# Patient Record
Sex: Female | Born: 1975 | Race: White | Hispanic: No | Marital: Single | Smoking: Current every day smoker
Health system: Southern US, Community
[De-identification: ages and names within clinical notes are randomized; demographics above are authoritative.]

## PROBLEM LIST (undated history)

## (undated) DIAGNOSIS — Z8489 Family history of other specified conditions: Secondary | ICD-10-CM

## (undated) DIAGNOSIS — F329 Major depressive disorder, single episode, unspecified: Secondary | ICD-10-CM

## (undated) DIAGNOSIS — C801 Malignant (primary) neoplasm, unspecified: Secondary | ICD-10-CM

## (undated) DIAGNOSIS — J189 Pneumonia, unspecified organism: Secondary | ICD-10-CM

## (undated) DIAGNOSIS — N200 Calculus of kidney: Secondary | ICD-10-CM

## (undated) DIAGNOSIS — Z87442 Personal history of urinary calculi: Secondary | ICD-10-CM

## (undated) DIAGNOSIS — F32A Depression, unspecified: Secondary | ICD-10-CM

## (undated) DIAGNOSIS — F319 Bipolar disorder, unspecified: Secondary | ICD-10-CM

## (undated) DIAGNOSIS — F419 Anxiety disorder, unspecified: Secondary | ICD-10-CM

## (undated) DIAGNOSIS — G8929 Other chronic pain: Secondary | ICD-10-CM

## (undated) DIAGNOSIS — F4310 Post-traumatic stress disorder, unspecified: Secondary | ICD-10-CM

## (undated) DIAGNOSIS — D649 Anemia, unspecified: Secondary | ICD-10-CM

## (undated) DIAGNOSIS — M549 Dorsalgia, unspecified: Secondary | ICD-10-CM

## (undated) DIAGNOSIS — F609 Personality disorder, unspecified: Secondary | ICD-10-CM

## (undated) HISTORY — PX: WISDOM TOOTH EXTRACTION: SHX21

## (undated) HISTORY — PX: CHOLECYSTECTOMY: SHX55

## (undated) HISTORY — PX: PARTIAL HYSTERECTOMY: SHX80

## (undated) HISTORY — DX: Calculus of kidney: N20.0

## (undated) HISTORY — PX: FRACTURE SURGERY: SHX138

## (undated) HISTORY — PX: TEMPOROMANDIBULAR JOINT SURGERY: SHX35

---

## 2002-07-02 ENCOUNTER — Emergency Department (HOSPITAL_COMMUNITY): Admission: EM | Admit: 2002-07-02 | Discharge: 2002-07-02 | Payer: Self-pay | Admitting: Emergency Medicine

## 2002-07-02 ENCOUNTER — Encounter: Payer: Self-pay | Admitting: Emergency Medicine

## 2003-11-09 DIAGNOSIS — D235 Other benign neoplasm of skin of trunk: Secondary | ICD-10-CM

## 2003-11-09 DIAGNOSIS — Z85820 Personal history of malignant melanoma of skin: Secondary | ICD-10-CM | POA: Insufficient documentation

## 2003-11-09 HISTORY — DX: Personal history of malignant melanoma of skin: Z85.820

## 2003-11-09 HISTORY — DX: Other benign neoplasm of skin of trunk: D23.5

## 2004-06-22 ENCOUNTER — Emergency Department: Payer: Self-pay | Admitting: Emergency Medicine

## 2004-10-10 ENCOUNTER — Emergency Department: Payer: Self-pay | Admitting: Emergency Medicine

## 2004-11-27 ENCOUNTER — Emergency Department: Payer: Self-pay | Admitting: Emergency Medicine

## 2005-02-07 ENCOUNTER — Emergency Department: Payer: Self-pay | Admitting: Emergency Medicine

## 2005-03-20 ENCOUNTER — Ambulatory Visit: Payer: Self-pay

## 2005-03-23 ENCOUNTER — Ambulatory Visit: Payer: Self-pay | Admitting: Urology

## 2005-03-30 ENCOUNTER — Ambulatory Visit: Payer: Self-pay | Admitting: Urology

## 2005-05-09 ENCOUNTER — Ambulatory Visit: Payer: Self-pay

## 2005-05-30 ENCOUNTER — Ambulatory Visit: Payer: Self-pay | Admitting: Unknown Physician Specialty

## 2005-06-09 ENCOUNTER — Emergency Department: Payer: Self-pay | Admitting: Emergency Medicine

## 2005-07-20 ENCOUNTER — Emergency Department: Payer: Self-pay | Admitting: Emergency Medicine

## 2005-07-20 ENCOUNTER — Other Ambulatory Visit: Payer: Self-pay

## 2005-09-10 ENCOUNTER — Emergency Department: Payer: Self-pay | Admitting: Emergency Medicine

## 2005-09-10 ENCOUNTER — Other Ambulatory Visit: Payer: Self-pay

## 2005-09-19 ENCOUNTER — Emergency Department: Payer: Self-pay | Admitting: Emergency Medicine

## 2006-02-25 ENCOUNTER — Emergency Department: Payer: Self-pay | Admitting: Emergency Medicine

## 2006-04-19 IMAGING — CT CT CHEST W/ CM
2 series · 16 of 31 positions shown, 20 images · non-contrast
Comparison: none

REASON FOR EXAM: ? PE
COMMENTS:  LMP: > one month ago

PROCEDURE:     CT  - CT CHEST (FOR PE) W  - July 20, 2005  [DATE]
RESULT:          Reason for consultation is chest pain.
TECHNIQUE: Axial contrast studies were obtained from the apices to the
hemidiaphragms with mediastinal and lung window settings obtained.
A good bolus of contrast is noted in the pulmonary arteries.  No filling
defects are noted to suggest pulmonary emboli.
No mediastinal masses are seen.  No hilar or subcarinal adenopathy is noted.
On the lung window settings, the lung fields are clear.  No effusions are
noted.

[Series 4: soft tissue · axial · 0.72mm/px · z∈[+227,+320]mm · 3 of 100 slices shown]
[im 8/100  mediastinal]
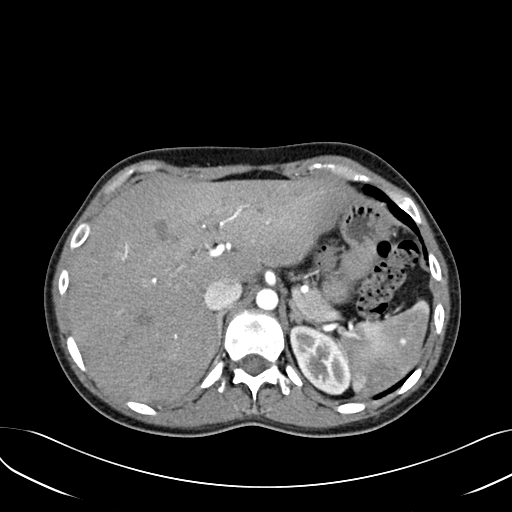
[im 23/100  mediastinal]
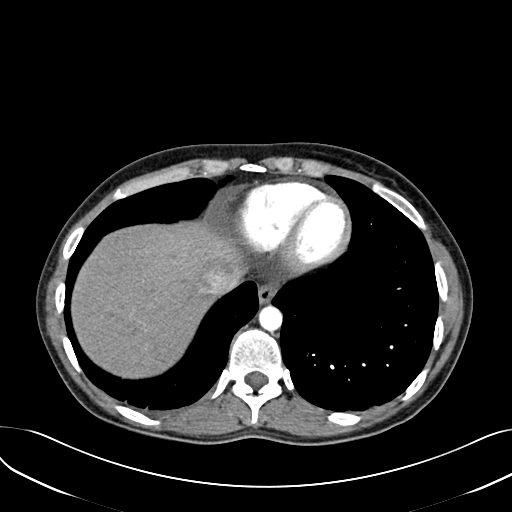
[im 39/100  mediastinal]
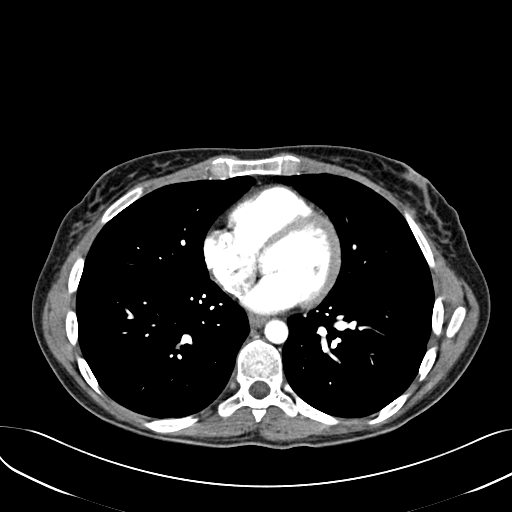

[Series 5: lung windows · axial · 0.72mm/px · z∈[+236,+479]mm · 13 of 97 slices shown, 17 images]
[im 8/97  mediastinal]
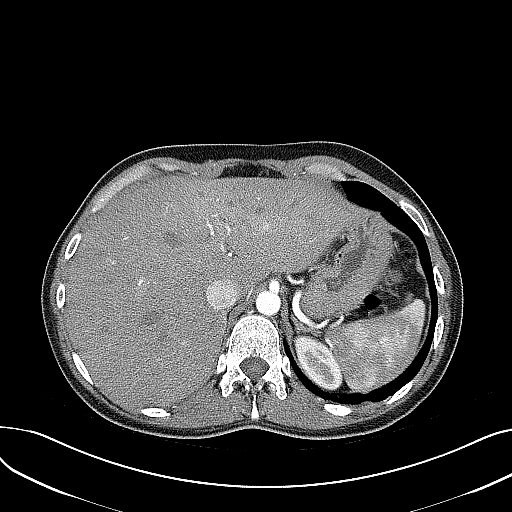
[im 8/97  lung]
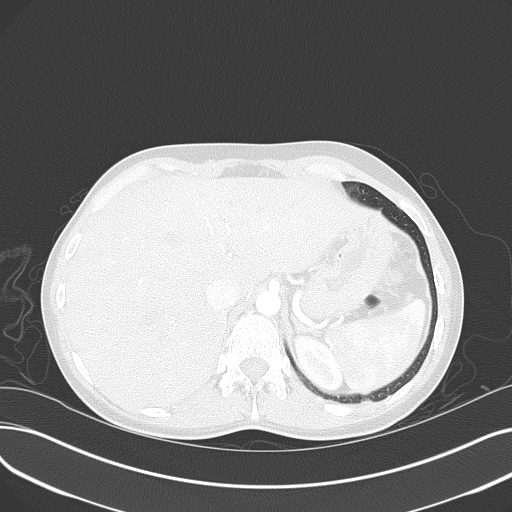
[im 15/97  lung]
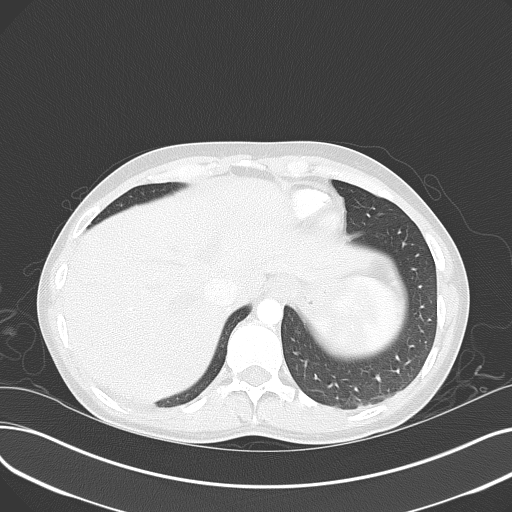
[im 23/97  lung]
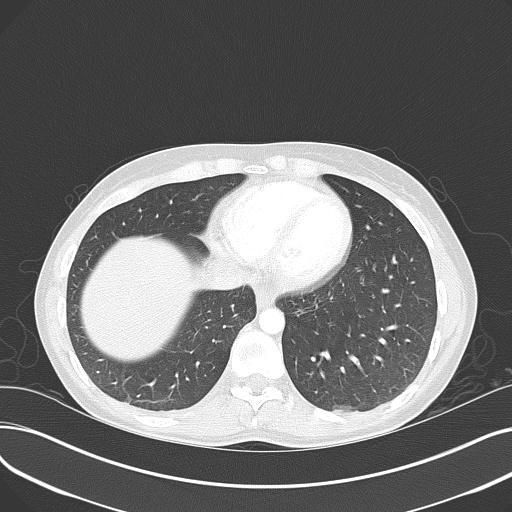
[im 30/97  lung]
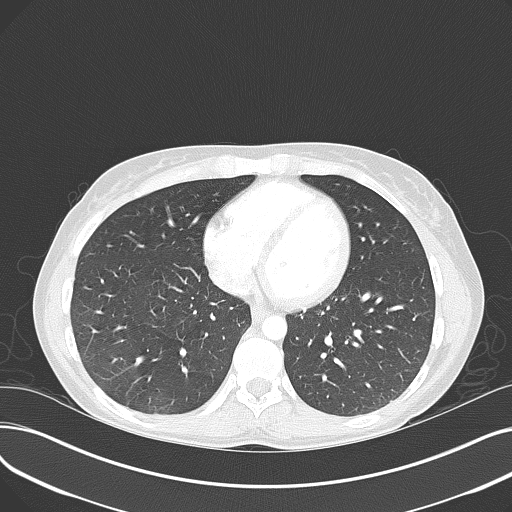
[im 37/97  mediastinal]
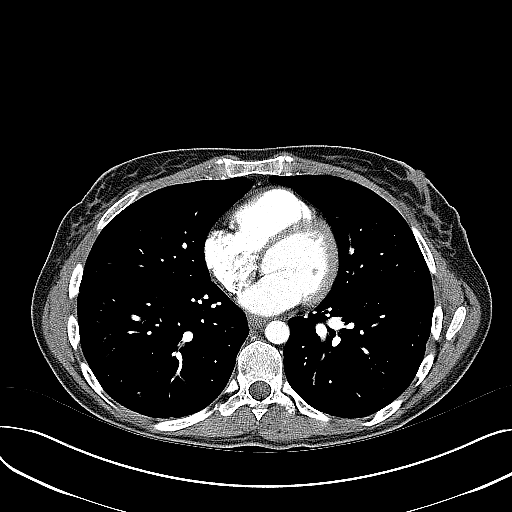
[im 37/97  lung]
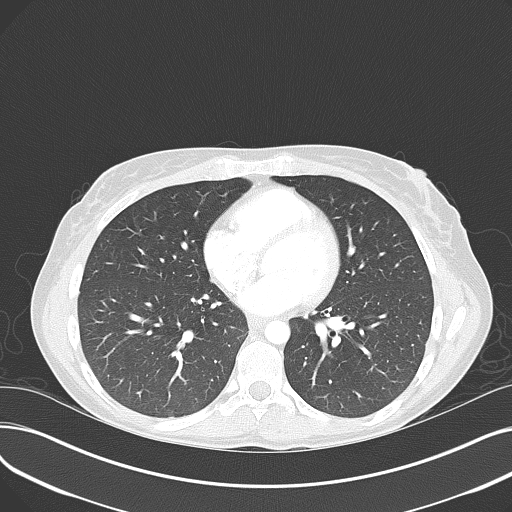
[im 45/97  lung]
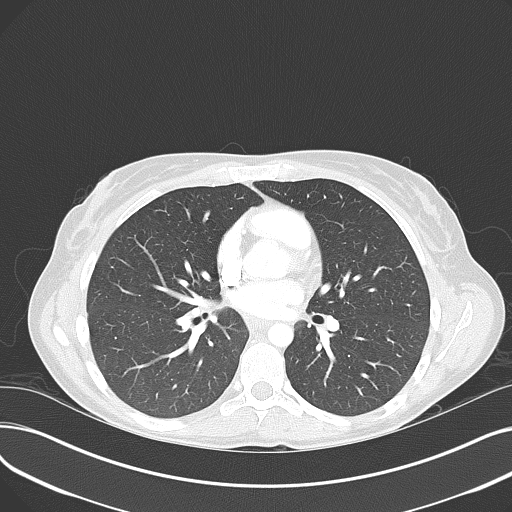
[im 49/97  lung]
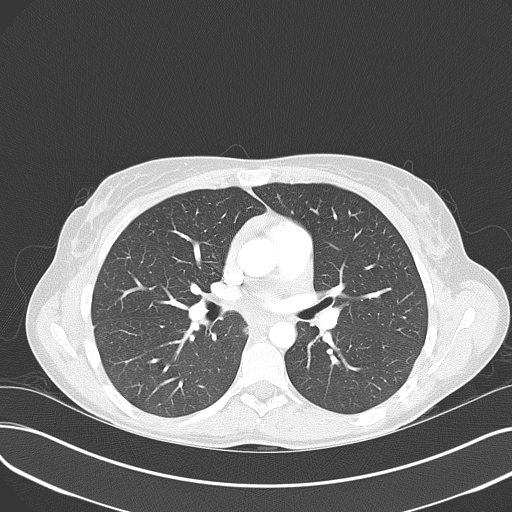
[im 52/97  lung]
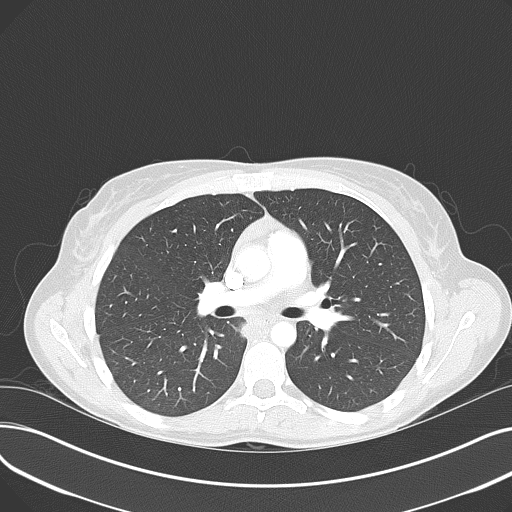
[im 60/97  mediastinal]
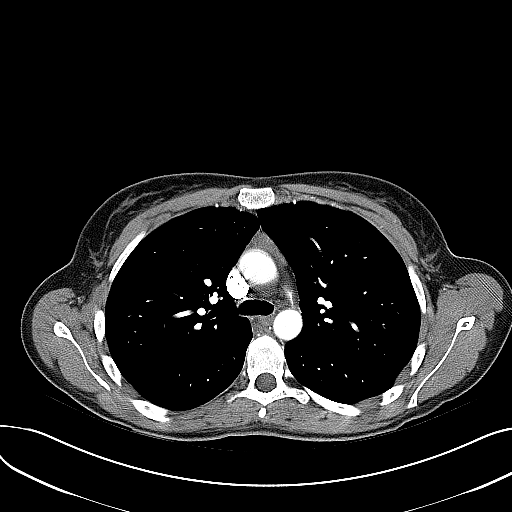
[im 60/97  lung]
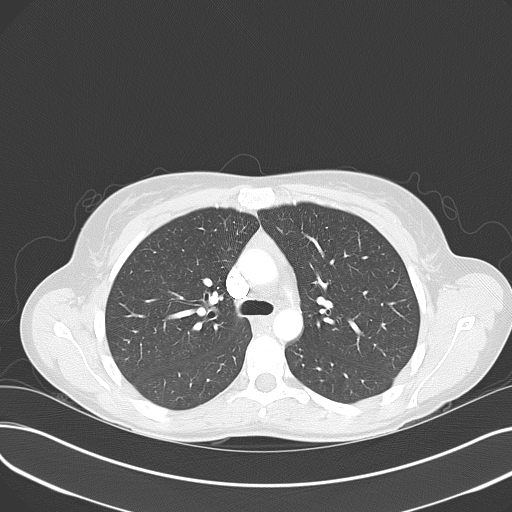
[im 67/97  lung]
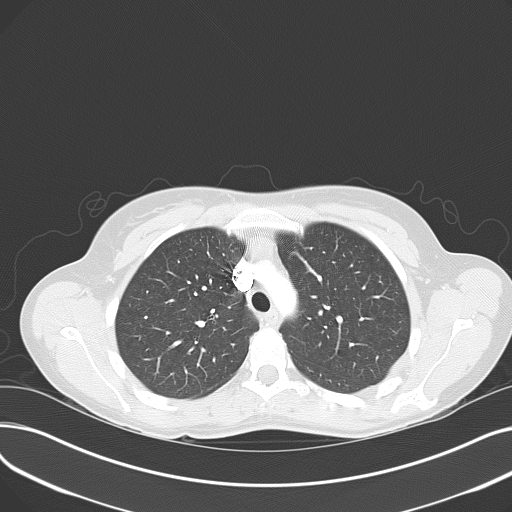
[im 74/97  lung]
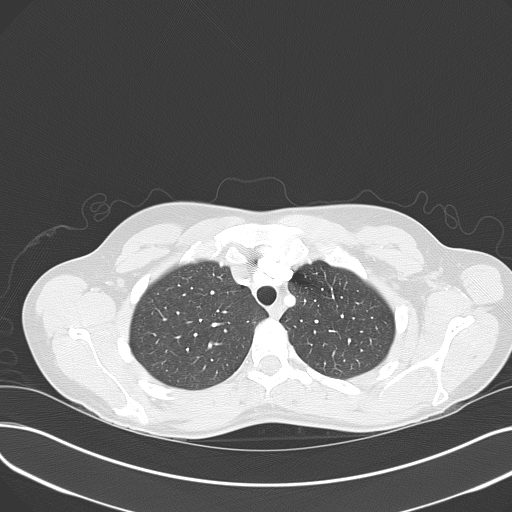
[im 82/97  lung]
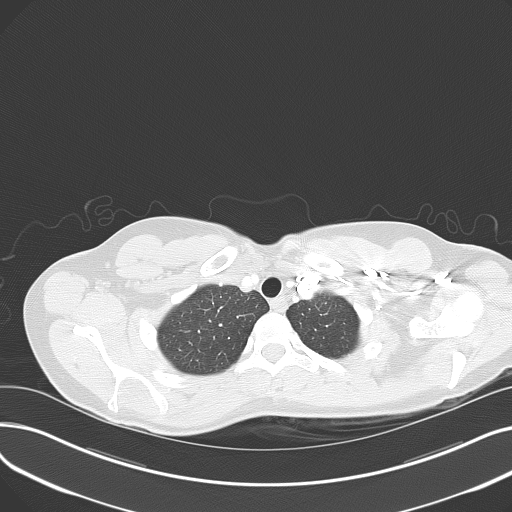
[im 89/97  mediastinal]
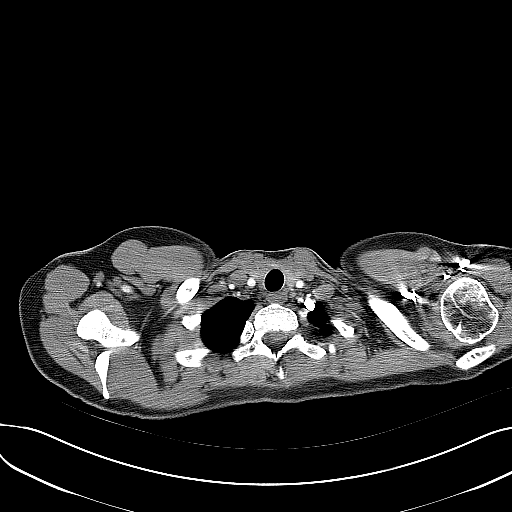
[im 89/97  lung]
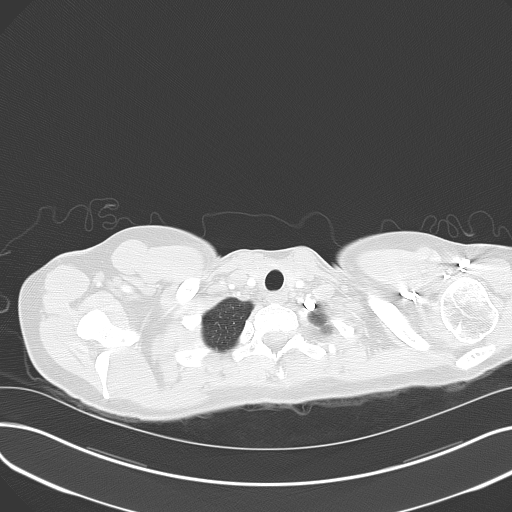

[16 of 31 positions shown; findings below may reference images not displayed]

IMPRESSION: No pulmonary emboli are identified.

The emergency room was contacted at the conclusion of the dictation.

## 2006-05-15 ENCOUNTER — Emergency Department: Payer: Self-pay | Admitting: Emergency Medicine

## 2006-06-18 ENCOUNTER — Ambulatory Visit: Payer: Self-pay | Admitting: Urology

## 2006-07-22 ENCOUNTER — Emergency Department: Payer: Self-pay | Admitting: Emergency Medicine

## 2006-08-12 ENCOUNTER — Emergency Department: Payer: Self-pay | Admitting: Unknown Physician Specialty

## 2006-09-24 ENCOUNTER — Emergency Department: Payer: Self-pay | Admitting: Emergency Medicine

## 2006-09-24 ENCOUNTER — Other Ambulatory Visit: Payer: Self-pay

## 2006-09-25 ENCOUNTER — Other Ambulatory Visit: Payer: Self-pay

## 2006-09-25 ENCOUNTER — Inpatient Hospital Stay: Payer: Self-pay | Admitting: Internal Medicine

## 2006-10-25 ENCOUNTER — Ambulatory Visit: Payer: Self-pay | Admitting: Internal Medicine

## 2006-11-15 ENCOUNTER — Ambulatory Visit: Payer: Self-pay | Admitting: Internal Medicine

## 2007-01-09 ENCOUNTER — Ambulatory Visit: Payer: Self-pay | Admitting: Internal Medicine

## 2007-02-26 ENCOUNTER — Emergency Department: Payer: Self-pay | Admitting: Emergency Medicine

## 2007-04-19 ENCOUNTER — Ambulatory Visit: Payer: Self-pay | Admitting: Internal Medicine

## 2007-06-24 IMAGING — CT CT HEAD WITHOUT CONTRAST
2 series · 16 of 30 positions shown, 20 images · non-contrast
Comparison: none

REASON FOR EXAM: seizure
COMMENTS:

[Series 2: without · axial · non-contrast · 0.41mm/px · z∈[+872,+992]mm · 13 of 30 slices shown, 17 images]
[im 3/30  brain]
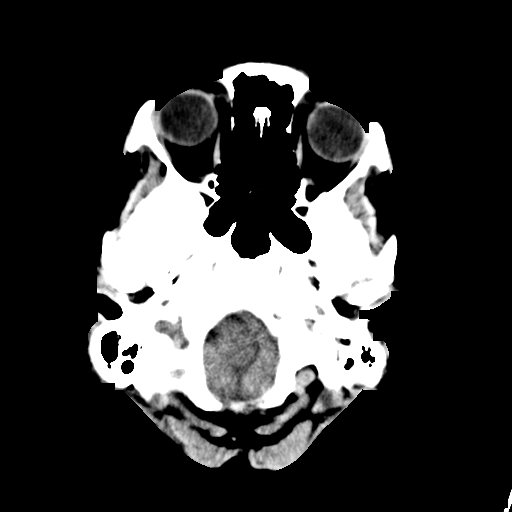
[im 3/30  bone]
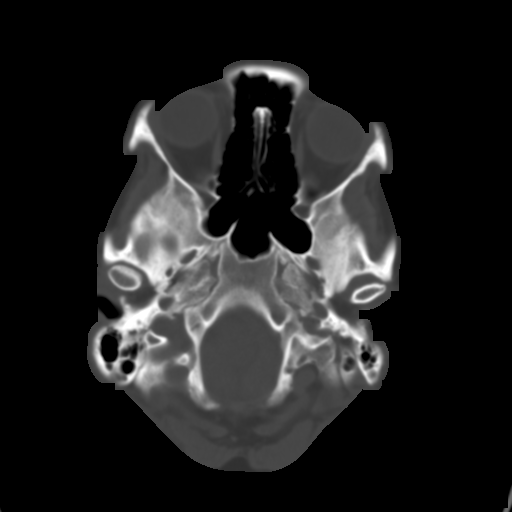
[im 5/30  brain]
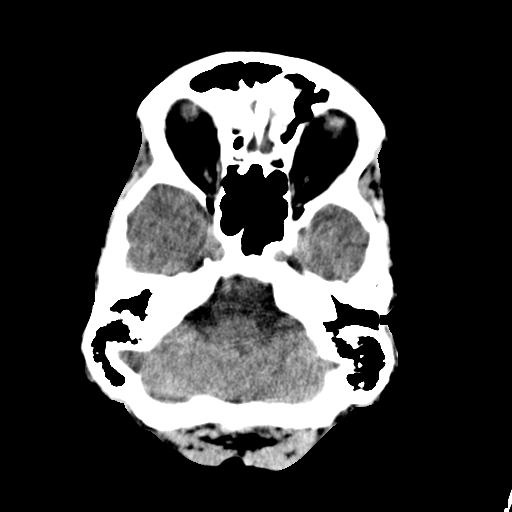
[im 7/30  brain]
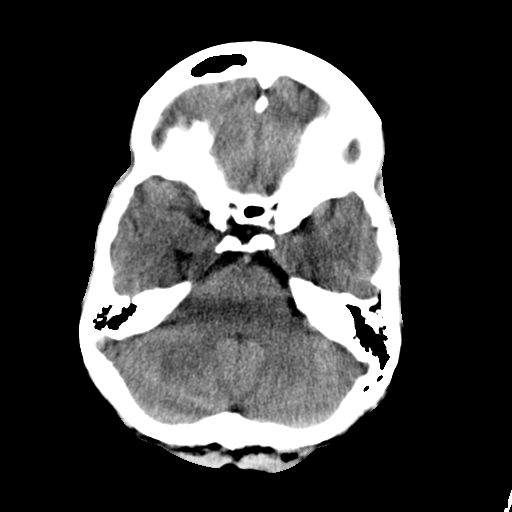
[im 9/30  brain]
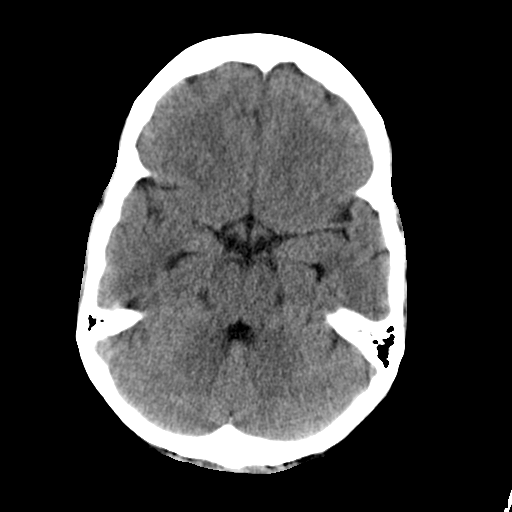
[im 11/30  brain]
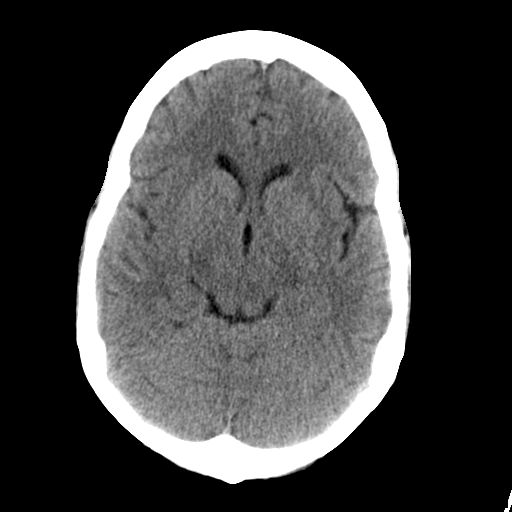
[im 11/30  bone]
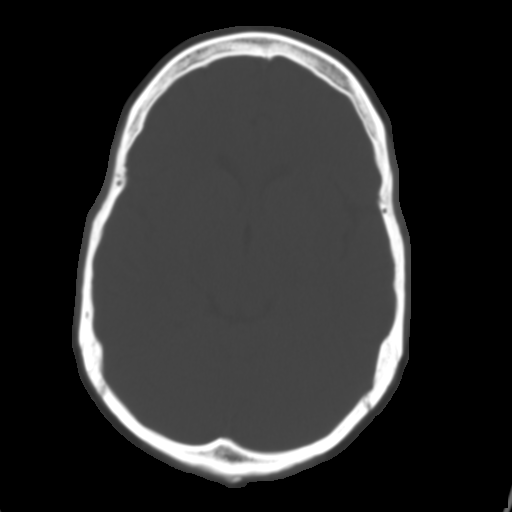
[im 13/30  brain]
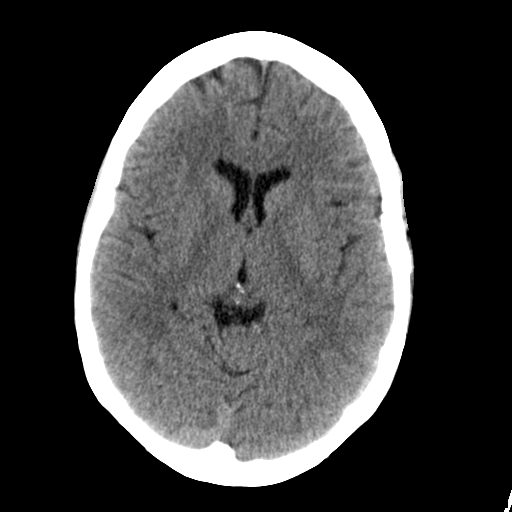
[im 15/30  brain]
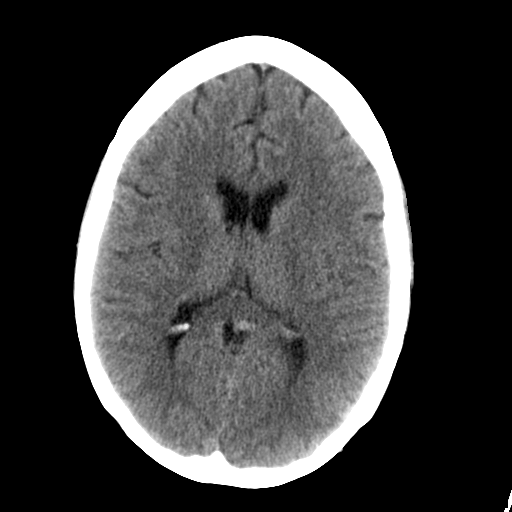
[im 17/30  brain]
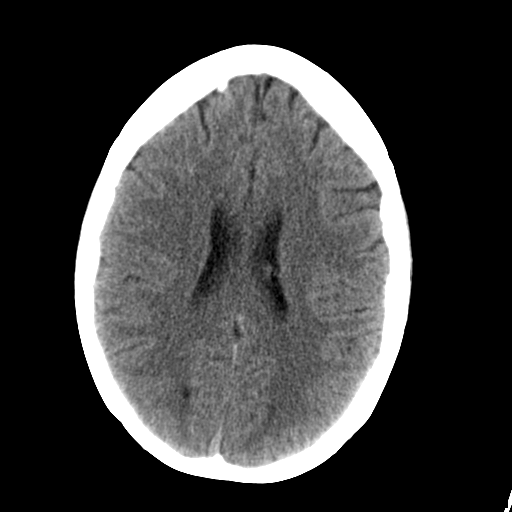
[im 19/30  brain]
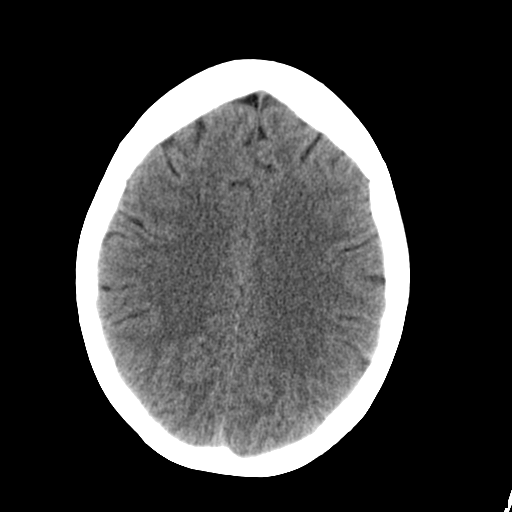
[im 19/30  bone]
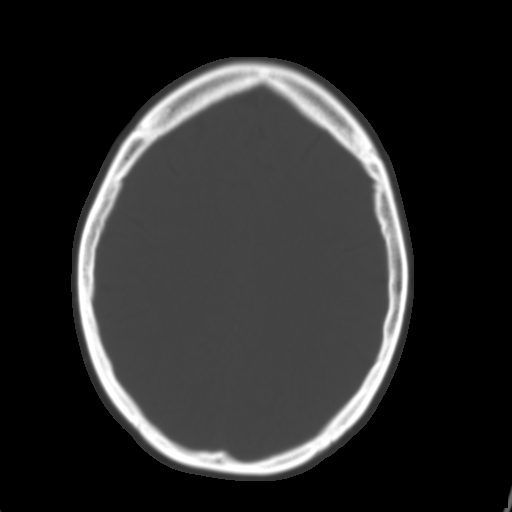
[im 21/30  brain]
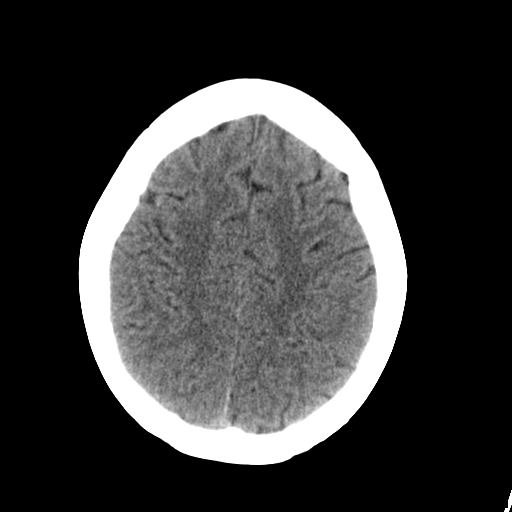
[im 23/30  brain]
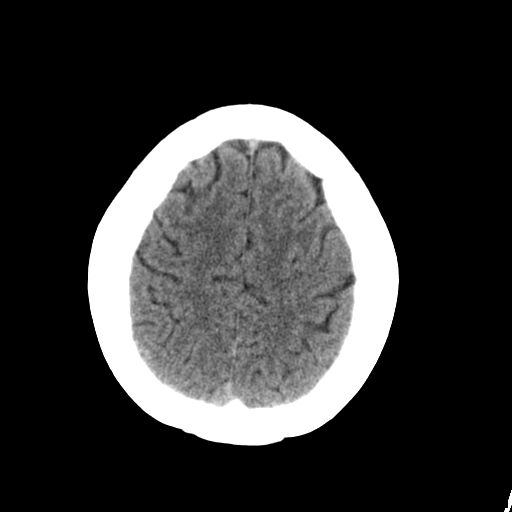
[im 25/30  brain]
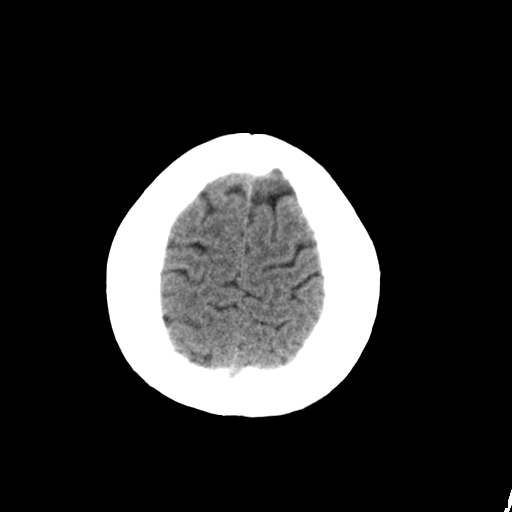
[im 27/30  brain]
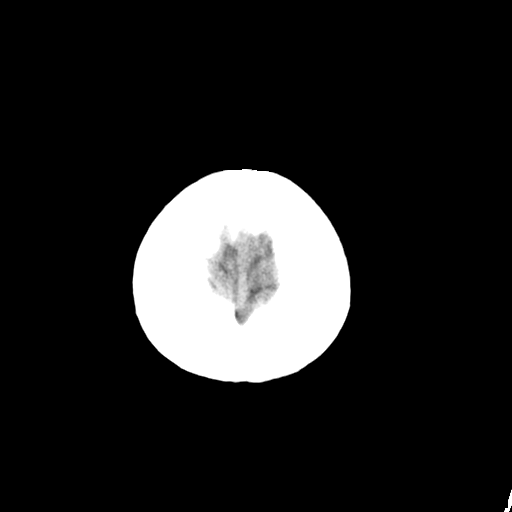
[im 27/30  bone]
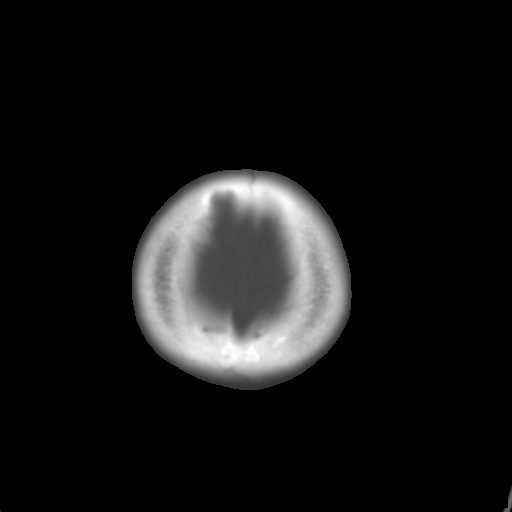

[Series 3: bone · axial · 0.41mm/px · z∈[+872,+912]mm · 3 of 30 slices shown]
[im 3/30  bone]
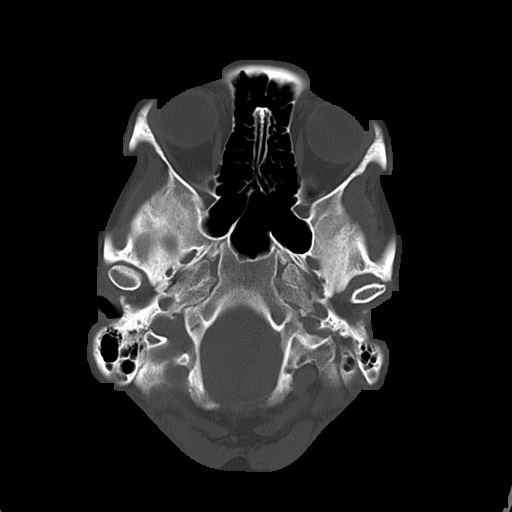
[im 7/30  bone]
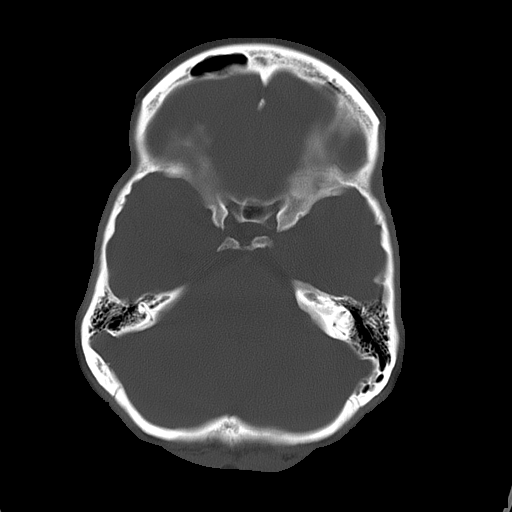
[im 11/30  bone]
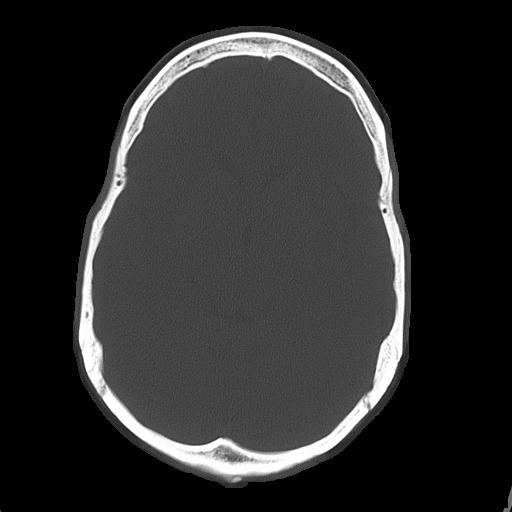

[16 of 30 positions shown; findings below may reference images not displayed]

PROCEDURE:     CT  - CT HEAD WITHOUT CONTRAST  - September 24, 2006  [DATE]

RESULT:     Emergent noncontrast CT of the brain is performed. There are no
prior studies for comparison. The ventricles and sulci are normal. There is
no hemorrhage. There is no focal mass, mass-effect or midline shift. There
is no evidence of edema or territorial infarct. The bone windows demonstrate
normal aeration of the paranasal sinuses and mastoid air cells. There is no
skull fracture demonstrated.
IMPRESSION: 1. No acute intracranial abnormality.
2. If this is a new onset of seizures an MRI would be suggested for further
investigation.

## 2007-09-29 ENCOUNTER — Ambulatory Visit: Payer: Self-pay | Admitting: Internal Medicine

## 2007-10-02 ENCOUNTER — Emergency Department: Payer: Self-pay | Admitting: Emergency Medicine

## 2007-10-30 ENCOUNTER — Emergency Department: Payer: Self-pay | Admitting: Unknown Physician Specialty

## 2008-05-13 ENCOUNTER — Emergency Department: Payer: Self-pay | Admitting: Unknown Physician Specialty

## 2008-05-24 ENCOUNTER — Emergency Department: Payer: Self-pay | Admitting: Emergency Medicine

## 2008-07-29 ENCOUNTER — Ambulatory Visit: Payer: Self-pay | Admitting: Specialist

## 2008-08-15 ENCOUNTER — Inpatient Hospital Stay: Payer: Self-pay | Admitting: Unknown Physician Specialty

## 2008-09-10 ENCOUNTER — Inpatient Hospital Stay: Payer: Self-pay | Admitting: Psychiatry

## 2009-03-22 ENCOUNTER — Ambulatory Visit: Payer: Self-pay | Admitting: Internal Medicine

## 2009-12-25 ENCOUNTER — Emergency Department: Payer: Self-pay | Admitting: Emergency Medicine

## 2010-11-15 ENCOUNTER — Emergency Department: Payer: Self-pay | Admitting: Emergency Medicine

## 2011-05-25 ENCOUNTER — Emergency Department: Payer: Self-pay | Admitting: Unknown Physician Specialty

## 2011-09-01 ENCOUNTER — Emergency Department: Payer: Self-pay | Admitting: Emergency Medicine

## 2011-09-01 ENCOUNTER — Ambulatory Visit: Payer: Self-pay | Admitting: Anesthesiology

## 2011-09-01 LAB — COMPREHENSIVE METABOLIC PANEL
Albumin: 4.4 g/dL (ref 3.4–5.0)
Alkaline Phosphatase: 73 U/L (ref 50–136)
Anion Gap: 10 (ref 7–16)
BUN: 13 mg/dL (ref 7–18)
Bilirubin,Total: 0.7 mg/dL (ref 0.2–1.0)
Creatinine: 1.11 mg/dL (ref 0.60–1.30)
EGFR (African American): >60
Sodium: 138 mmol/L (ref 136–145)
Total Protein: 8.4 g/dL — ABNORMAL HIGH (ref 6.4–8.2)

## 2011-09-01 LAB — CBC
HGB: 13.6 g/dL (ref 12.0–16.0)
MCHC: 32.9 g/dL (ref 32.0–36.0)
Platelet: 297 10*3/uL (ref 150–440)
WBC: 11.2 10*3/uL — ABNORMAL HIGH (ref 3.6–11.0)

## 2011-09-01 LAB — URINALYSIS, COMPLETE
Bacteria: NONE SEEN
Glucose,UR: NEGATIVE mg/dL (ref 0–75)
Leukocyte Esterase: NEGATIVE
Nitrite: NEGATIVE
Ph: 7 (ref 4.5–8.0)
Protein: 30
RBC,UR: 5 /HPF (ref 0–5)
Specific Gravity: 1.021 (ref 1.003–1.030)

## 2012-01-24 ENCOUNTER — Ambulatory Visit: Payer: Self-pay | Admitting: Family Medicine

## 2012-01-24 LAB — CREATININE, SERUM
EGFR (African American): >60
EGFR (Non-African Amer.): >60

## 2012-02-05 ENCOUNTER — Emergency Department (HOSPITAL_COMMUNITY)
Admission: EM | Admit: 2012-02-05 | Discharge: 2012-02-05 | Disposition: A | Discharge status: Home / Self Care | Payer: Medicare Other | Attending: Emergency Medicine | Admitting: Emergency Medicine

## 2012-02-05 ENCOUNTER — Encounter (HOSPITAL_COMMUNITY): Payer: Self-pay

## 2012-02-05 ENCOUNTER — Emergency Department (HOSPITAL_COMMUNITY): Payer: Medicare Other

## 2012-02-05 DIAGNOSIS — W19XXXA Unspecified fall, initial encounter: Secondary | ICD-10-CM

## 2012-02-05 DIAGNOSIS — W1809XA Striking against other object with subsequent fall, initial encounter: Secondary | ICD-10-CM | POA: Insufficient documentation

## 2012-02-05 DIAGNOSIS — Z7982 Long term (current) use of aspirin: Secondary | ICD-10-CM | POA: Insufficient documentation

## 2012-02-05 DIAGNOSIS — R51 Headache: Secondary | ICD-10-CM | POA: Insufficient documentation

## 2012-02-05 DIAGNOSIS — S139XXA Sprain of joints and ligaments of unspecified parts of neck, initial encounter: Secondary | ICD-10-CM | POA: Insufficient documentation

## 2012-02-05 DIAGNOSIS — M542 Cervicalgia: Secondary | ICD-10-CM | POA: Insufficient documentation

## 2012-02-05 DIAGNOSIS — Z79899 Other long term (current) drug therapy: Secondary | ICD-10-CM | POA: Insufficient documentation

## 2012-02-05 HISTORY — DX: Other chronic pain: G89.29

## 2012-02-05 HISTORY — DX: Depression, unspecified: F32.A

## 2012-02-05 HISTORY — DX: Personality disorder, unspecified: F60.9

## 2012-02-05 HISTORY — DX: Malignant (primary) neoplasm, unspecified: C80.1

## 2012-02-05 HISTORY — DX: Anxiety disorder, unspecified: F41.9

## 2012-02-05 HISTORY — DX: Post-traumatic stress disorder, unspecified: F43.10

## 2012-02-05 HISTORY — DX: Bipolar disorder, unspecified: F31.9

## 2012-02-05 HISTORY — DX: Major depressive disorder, single episode, unspecified: F32.9

## 2012-02-05 HISTORY — DX: Dorsalgia, unspecified: M54.9

## 2012-02-05 MED ORDER — CYCLOBENZAPRINE HCL 10 MG PO TABS: 10.0000 mg | ORAL_TABLET | Freq: Two times a day (BID) | ORAL | Status: DC | PRN

## 2012-02-05 MED ORDER — HYDROCODONE-ACETAMINOPHEN 5-325 MG PO TABS
1.0000 | ORAL_TABLET | Freq: Once | ORAL | Status: AC
  Administered 2012-02-05: 1 via ORAL
  Filled 2012-02-05: qty 1

## 2012-02-05 MED ORDER — TRAMADOL HCL 50 MG PO TABS: 50.0000 mg | ORAL_TABLET | Freq: Four times a day (QID) | ORAL | Status: DC | PRN

## 2012-02-05 MED ORDER — IBUPROFEN 800 MG PO TABS: 800.0000 mg | ORAL_TABLET | Freq: Three times a day (TID) | ORAL | Status: DC

## 2012-02-05 NOTE — ED Provider Notes (Signed)
History     CSN: [id number]  Arrival date & time 02/05/12  1311   First MD Initiated Contact with Patient 02/05/12 1347      Chief Complaint  Patient presents with  . Neck Pain    Per EMS-pt stated that she struck her head in restroom of MD office. Pt was alert and oriented, c/o neck pain     (Consider location/radiation/quality/duration/timing/severity/associated sxs/prior treatment) Patient is a 36 y.o. female presenting with neck pain. The history is provided by the patient.  Neck Pain  The current episode started 1 to 2 hours ago. The problem occurs constantly. The pain is associated with a fall. There has been no fever. Associated symptoms include headaches. Associated symptoms comments: She reports slipping on wet surface, falling and hitting head against a bathroom stall partition. No LOC. No nausea or vomiting. She complains of head and neck pain..    Past Medical History  Diagnosis Date  . Anxiety   . Bipolar disorder   . Depression   . Personality disorder   . PTSD (post-traumatic stress disorder)   . Cancer skin cancer  . Chronic back pain     Past Surgical History  Procedure Date  . Partial hysterectomy     No family history on file.  History  Substance Use Topics  . Smoking status: Current Every Day Smoker  . Smokeless tobacco: Not on file  . Alcohol Use: No    OB History    Grav Para Term Preterm Abortions TAB SAB Ect Mult Living                  Review of Systems  Constitutional: Negative for fever.  HENT: Positive for neck pain.   Gastrointestinal: Negative for nausea and vomiting.  Neurological: Positive for headaches.    Allergies  Fentanyl; Neosporin; and Zantac  Home Medications   Current Outpatient Rx  Name Route Sig Dispense Refill  . ALPRAZOLAM 1 MG PO TABS Oral Take 1 mg by mouth 4 (four) times daily as needed.    . AMPHETAMINE-DEXTROAMPHETAMINE 15 MG PO TABS Oral Take 30 mg by mouth daily.    . ASPIRIN-ACETAMINOPHEN-CAFFEINE  250-250-65 MG PO TABS Oral Take 1 tablet by mouth every 6 (six) hours as needed. For migraine    . GOODY HEADACHE PO Oral Take 1 packet by mouth every 6 (six) hours as needed. For aches    . CLONIDINE HCL 0.1 MG PO TABS Oral Take 0.1 mg by mouth 2 (two) times daily as needed. For high blood pressure    . DESVENLAFAXINE SUCCINATE ER 100 MG PO TB24 Oral Take 100 mg by mouth daily.    Marland Kitchen GABAPENTIN 600 MG PO TABS Oral Take 600 mg by mouth 3 (three) times daily.    . IBUPROFEN 200 MG PO TABS Oral Take 200-800 mg by mouth every 6 (six) hours as needed. For pain    . MELATONIN PO Oral Take 1 tablet by mouth at bedtime.    Marland Kitchen METFORMIN HCL 500 MG PO TABS Oral Take 500 mg by mouth 2 (two) times daily with a meal.    . METOCLOPRAMIDE HCL 10 MG PO TABS Oral Take 10 mg by mouth 2 (two) times daily.    Marland Kitchen MODAFINIL 200 MG PO TABS Oral Take 200 mg by mouth daily with breakfast.    . NAPROXEN SODIUM 220 MG PO TABS Oral Take 220 mg by mouth 2 (two) times daily with a meal.    . NORGESTIMATE-ETH  ESTRADIOL 0.25-35 MG-MCG PO TABS Oral Take 1 tablet by mouth daily.    . OXYBUTYNIN CHLORIDE 5 MG PO TABS Oral Take 5 mg by mouth 2 (two) times daily.    . OXYCODONE HCL 15 MG PO TABS Oral Take 15 mg by mouth every 4 (four) hours as needed. For pain    . PANTOPRAZOLE SODIUM 40 MG PO TBEC Oral Take 40 mg by mouth daily.    Marland Kitchen POTASSIUM CHLORIDE ER 10 MEQ PO TBCR Oral Take 10 mEq by mouth daily.    Marland Kitchen PROMETHAZINE HCL 25 MG PO TABS Oral Take 25 mg by mouth 2 (two) times daily.    Marland Kitchen GAS RELIEF PO Oral Take 1 tablet by mouth as needed. For gas relief    . SIMVASTATIN 20 MG PO TABS Oral Take 20 mg by mouth every evening.      BP 120/77  Pulse 90  Temp 98.2 F (36.8 C) (Oral)  Resp 20  SpO2 97%  Physical Exam  Constitutional: She is oriented to person, place, and time. She appears well-developed and well-nourished.  HENT:  Head: Normocephalic and atraumatic.  Eyes: Pupils are equal, round, and reactive to light.  Neck:  Normal range of motion. Neck supple.  Cardiovascular: Normal rate and regular rhythm.   Pulmonary/Chest: Effort normal and breath sounds normal.  Abdominal: Soft. Bowel sounds are normal. There is no tenderness. There is no rebound and no guarding.  Musculoskeletal: Normal range of motion.       Multi-level midline cervical and paracervical tenderness. No swelling, no bruising.   Neurological: She is alert and oriented to person, place, and time. She has normal strength and normal reflexes. No sensory deficit. She displays a negative Romberg sign. Coordination normal.  Skin: Skin is warm and dry. No rash noted.  Psychiatric: She has a normal mood and affect.    ED Course  Procedures (including critical care time)  Labs Reviewed - No data to display Ct Head Wo Contrast  02/05/2012  *RADIOLOGY REPORT*  Clinical Data:  Fall, injury to back of head.  CT HEAD WITHOUT CONTRAST CT CERVICAL SPINE WITHOUT CONTRAST  Technique:  Multidetector CT imaging of the head and cervical spine was performed following the standard protocol without intravenous contrast.  Multiplanar CT image reconstructions of the cervical spine were also generated.  Comparison:   None  CT HEAD  Findings: No intracranial hemorrhage.  No parenchymal contusion. No midline shift or mass effect.  Basilar cisterns are patent. No skull base fracture.  No fluid in the paranasal sinuses or mastoid air cells.  IMPRESSION: No intracranial trauma.  Normal head CT.  CT CERVICAL SPINE  Findings: No prevertebral soft tissue swelling.  Normal alignment of cervical vertebral bodies.  No loss of vertebral body height. Normal facet articulation.  Normal craniocervical junction.  No evidence epidural or paraspinal hematoma.  IMPRESSION: No cervical spine fracture.   Original Report Authenticated By: Genevive Bi, M.D.    Ct Cervical Spine Wo Contrast  02/05/2012  *RADIOLOGY REPORT*  Clinical Data:  Fall, injury to back of head.  CT HEAD WITHOUT  CONTRAST CT CERVICAL SPINE WITHOUT CONTRAST  Technique:  Multidetector CT imaging of the head and cervical spine was performed following the standard protocol without intravenous contrast.  Multiplanar CT image reconstructions of the cervical spine were also generated.  Comparison:   None  CT HEAD  Findings: No intracranial hemorrhage.  No parenchymal contusion. No midline shift or mass effect.  Basilar cisterns are  patent. No skull base fracture.  No fluid in the paranasal sinuses or mastoid air cells.  IMPRESSION: No intracranial trauma.  Normal head CT.  CT CERVICAL SPINE  Findings: No prevertebral soft tissue swelling.  Normal alignment of cervical vertebral bodies.  No loss of vertebral body height. Normal facet articulation.  Normal craniocervical junction.  No evidence epidural or paraspinal hematoma.  IMPRESSION: No cervical spine fracture.   Original Report Authenticated By: Genevive Bi, M.D.      No diagnosis found.  1. Fall 2. Cervical sprain   MDM  CT c-spine and head negative. Collar removed. Re-eval:  No swelling or bruising to head or neck after period of observation. Patient sleeping on re-eval without distress or obvious discomfort.         Rodena Medin, PA-C 02/05/12 1528

## 2012-02-05 NOTE — ED Notes (Signed)
Pt fell today at the psychiatrist's office, slipped in bathroom and hit the back of her head on the wall, no LOC and was able to crawl back to examination room, and ambulance was called. Pt is tearful and cried "I crawl to the doctor and the doctor just didn't care". Pt was the office for anxiety, takes xanax with no relief.

## 2012-02-05 NOTE — ED Notes (Signed)
Pt presented to ED on C-collar and LSB. Pt is now off the spinal board. Pain at lower back and mid back. Hx of chronic back pain and is getting her pain meds from the pain clinic.

## 2012-02-05 NOTE — ED Notes (Signed)
Patient transported to CT 

## 2012-02-05 NOTE — ED Provider Notes (Signed)
Medical screening examination/treatment/procedure(s) were performed by non-physician practitioner and as supervising physician I was immediately available for consultation/collaboration.  Jones Skene, M.D.     Jones Skene, MD 02/05/12 1814

## 2012-10-22 ENCOUNTER — Ambulatory Visit: Payer: Self-pay | Admitting: Anesthesiology

## 2014-11-19 ENCOUNTER — Other Ambulatory Visit: Payer: Self-pay | Admitting: Unknown Physician Specialty

## 2014-11-19 DIAGNOSIS — D37030 Neoplasm of uncertain behavior of the parotid salivary glands: Secondary | ICD-10-CM

## 2014-11-30 ENCOUNTER — Ambulatory Visit: Admission: RE | Admit: 2014-11-30 | Payer: Medicare Other | Source: Ambulatory Visit

## 2014-12-08 ENCOUNTER — Ambulatory Visit
Admission: RE | Admit: 2014-12-08 | Discharge: 2014-12-08 | Disposition: A | Discharge status: Home / Self Care | Payer: Medicare Other | Source: Ambulatory Visit | Attending: Unknown Physician Specialty | Admitting: Unknown Physician Specialty

## 2014-12-08 DIAGNOSIS — D37030 Neoplasm of uncertain behavior of the parotid salivary glands: Secondary | ICD-10-CM

## 2014-12-08 DIAGNOSIS — R590 Localized enlarged lymph nodes: Secondary | ICD-10-CM | POA: Diagnosis not present

## 2014-12-08 MED ORDER — IOHEXOL 300 MG/ML  SOLN
75.0000 mL | Freq: Once | INTRAMUSCULAR | Status: AC | PRN
  Administered 2014-12-08: 75 mL via INTRAVENOUS

## 2015-06-16 DIAGNOSIS — G4440 Drug-induced headache, not elsewhere classified, not intractable: Secondary | ICD-10-CM | POA: Insufficient documentation

## 2015-06-16 HISTORY — DX: Drug-induced headache, not elsewhere classified, not intractable: G44.40

## 2015-07-26 DIAGNOSIS — R519 Headache, unspecified: Secondary | ICD-10-CM | POA: Insufficient documentation

## 2015-07-26 HISTORY — DX: Headache, unspecified: R51.9

## 2015-12-14 ENCOUNTER — Other Ambulatory Visit: Payer: Self-pay | Admitting: Neurology

## 2015-12-14 DIAGNOSIS — R519 Headache, unspecified: Secondary | ICD-10-CM

## 2015-12-14 DIAGNOSIS — R51 Headache: Principal | ICD-10-CM

## 2015-12-23 ENCOUNTER — Ambulatory Visit
Admission: RE | Admit: 2015-12-23 | Discharge: 2015-12-23 | Disposition: A | Discharge status: Home / Self Care | Payer: Medicare Other | Source: Ambulatory Visit | Attending: Neurology | Admitting: Neurology

## 2015-12-23 DIAGNOSIS — R51 Headache: Secondary | ICD-10-CM | POA: Diagnosis present

## 2015-12-23 DIAGNOSIS — R519 Headache, unspecified: Secondary | ICD-10-CM

## 2015-12-23 NOTE — Progress Notes (Signed)
Patient seen by Dr. Rodrigo Ran.  Patient may be d/c home at 1245 if no problems-per Dr. Rodrigo Ran.

## 2021-06-08 ENCOUNTER — Other Ambulatory Visit (HOSPITAL_COMMUNITY): Payer: Self-pay | Admitting: Family Medicine

## 2021-06-08 ENCOUNTER — Other Ambulatory Visit: Payer: Self-pay | Admitting: Family Medicine

## 2021-06-08 DIAGNOSIS — R1011 Right upper quadrant pain: Secondary | ICD-10-CM

## 2021-06-16 ENCOUNTER — Ambulatory Visit
Admission: RE | Admit: 2021-06-16 | Discharge: 2021-06-16 | Disposition: A | Discharge status: Home / Self Care | Payer: Medicare Other | Source: Ambulatory Visit | Attending: Family Medicine | Admitting: Family Medicine

## 2021-06-16 ENCOUNTER — Other Ambulatory Visit: Payer: Self-pay

## 2021-06-16 DIAGNOSIS — R1011 Right upper quadrant pain: Secondary | ICD-10-CM | POA: Diagnosis present

## 2021-06-23 ENCOUNTER — Encounter: Payer: Self-pay | Admitting: Surgery

## 2021-06-23 ENCOUNTER — Ambulatory Visit (INDEPENDENT_AMBULATORY_CARE_PROVIDER_SITE_OTHER): Payer: Medicare Other | Admitting: Surgery

## 2021-06-23 ENCOUNTER — Ambulatory Visit: Payer: Self-pay | Admitting: Surgery

## 2021-06-23 ENCOUNTER — Other Ambulatory Visit: Payer: Self-pay

## 2021-06-23 ENCOUNTER — Telehealth: Payer: Self-pay | Admitting: Surgery

## 2021-06-23 VITALS — BP 109/67 | HR 74 | Temp 98.2°F | Ht 67.0 in | Wt 161.4 lb

## 2021-06-23 DIAGNOSIS — K8010 Calculus of gallbladder with chronic cholecystitis without obstruction: Secondary | ICD-10-CM | POA: Diagnosis not present

## 2021-06-23 NOTE — Progress Notes (Signed)
Patient ID: Charlotte Hopkins, female   DOB: 08/20/1975, 46 y.o.   MRN: [id number]  Chief Complaint: Right upper quadrant pain  History of Present Illness Charlotte Hopkins is a 46 y.o. female with an increasing history of postprandial right upper quadrant pain.  Classically occurring in the evenings after her greatest meal.  She is not entirely aware of the effect of fats in her diet, but admits she is a [place].  She reports normal bowel activity.  She denies nausea, vomiting, fevers and chills.  Her pain is right upper quadrant/subcostal, it feels like a lot of pressure and swelling, and it radiates to the back.  She is has no history of jaundice or hepatitis.  Past Medical History Past Medical History:  Diagnosis Date   Anxiety    Bipolar disorder ([place])    Cancer ([place]) skin cancer   Chronic back pain    Depression    Personality disorder ([place])    PTSD (post-traumatic stress disorder)       Past Surgical History:  Procedure Laterality Date   PARTIAL HYSTERECTOMY     TEMPOROMANDIBULAR JOINT SURGERY      Allergies  Allergen Reactions   Adhesive [Tape] Rash   Fentanyl    Neosporin [Neomycin-Bacitracin Zn-Polymyx]    Zantac [Ranitidine Hcl]     Current Outpatient Medications  Medication Sig Dispense Refill   ciprofloxacin (CIPRO) 500 MG tablet Take 500 mg by mouth 2 (two) times daily.     metroNIDAZOLE (FLAGYL) 500 MG tablet Take by mouth.     No current facility-administered medications for this visit.    Family History History reviewed. No pertinent family history.    Social History Social History   Tobacco Use   Smoking status: Every Day  Substance Use Topics   Alcohol use: No   Drug use: No        Review of Systems  Constitutional: Negative.   HENT: Negative.    Eyes: Negative.   Respiratory: Negative.    Cardiovascular: Negative.   Gastrointestinal:  Positive for abdominal pain.  Genitourinary: Negative.   Skin: Negative.   Neurological: Negative.    Psychiatric/Behavioral: Negative.       Physical Exam Blood pressure 109/67, pulse 74, temperature 98.2 F (36.8 C), temperature source Oral, height 5\' 7"  (1.702 m), weight 161 lb 6.4 oz (73.2 kg), SpO2 96 %. Last Weight  Most recent update: 06/23/2021  8:56 AM    Weight  73.2 kg (161 lb 6.4 oz)             CONSTITUTIONAL: Well developed, and nourished, appropriately responsive and aware without distress.   EYES: Sclera non-icteric.   EARS, NOSE, MOUTH AND THROAT: Mask worn.    Hearing is intact to voice.  NECK: Trachea is midline, and there is no jugular venous distension.  LYMPH NODES:  Lymph nodes in the neck are not enlarged. RESPIRATORY:  Lungs are clear, and breath sounds are equal bilaterally. Normal respiratory effort without pathologic use of accessory muscles. CARDIOVASCULAR: Heart is regular in rate and rhythm. GI: The abdomen is soft, nontender, and nondistended. There were no palpable masses. I did not appreciate hepatosplenomegaly. There were normal bowel sounds. MUSCULOSKELETAL:  Symmetrical muscle tone appreciated in all four extremities.    SKIN: Skin turgor is normal. No pathologic skin lesions appreciated.  NEUROLOGIC:  Motor and sensation appear grossly normal.  Cranial nerves are grossly without defect. PSYCH:  Alert and oriented to person, place and time. Affect is appropriate  for situation.  Data Reviewed I have personally reviewed what is currently available of the patient's imaging, recent labs and medical records.   Labs:  CBC Latest Ref Rng & Units 09/01/2011  WBC 3.6 - 11.0 x10 3/mm 3 11.2(H)  Hemoglobin 12.0 - 16.0 g/dL 13.6  Hematocrit 35.0 - 47.0 % 41.4  Platelets 150 - 440 x10 3/mm 3 297   CMP Latest Ref Rng & Units 01/24/2012 09/01/2011  Glucose 65 - 99 mg/dL - 100(H)  BUN 7 - 18 mg/dL - 13  Creatinine 0.60 - 1.30 mg/dL 0.53(L) 1.11  Sodium 136 - 145 mmol/L - 138  Potassium 3.5 - 5.1 mmol/L - 3.6  Chloride 98 - 107 mmol/L - 106  CO2 21 - 32  mmol/L - 22  Calcium 8.5 - 10.1 mg/dL - 9.9  Total Protein 6.4 - 8.2 g/dL - 8.4(H)  Total Bilirubin 0.2 - 1.0 mg/dL - 0.7  Alkaline Phos 50 - 136 Unit/L - 73  AST 15 - 37 Unit/L - 16  ALT U/L - 14   Imaging: Radiology review:  CLINICAL DATA:  RIGHT upper quadrant abdominal pain.   EXAM: ULTRASOUND ABDOMEN LIMITED RIGHT UPPER QUADRANT   COMPARISON:  CT AP, 08/12/2006.  US Abdomen report, 09/01/2011.   FINDINGS: Gallbladder:   Multiple echogenic and shadowing gallstones within a nondistended gallbladder, largest measuring up to 1.2 cm. No wall thickening visualized. No sonographic Murphy sign noted by sonographer.   Common bile duct:   Diameter: 0.3 cm   Liver:   No focal lesion identified. Within normal limits in parenchymal echogenicity. Portal vein is patent on color Doppler imaging with normal direction of blood flow towards the liver.   Other: None.   IMPRESSION: Cholelithiasis. No additional sonographic findings to suggest acute cholecystitis.     Electronically Signed   By: Michaelle Birks M.D.   On: 06/17/2021 12:58  Within last 24 hrs: No results found.  Assessment    Chronic calculus cholecystitis with biliary colic  There are no problems to display for this patient.   Plan    Robotic cholecystectomy with ICG imaging.  This was discussed thoroughly.  Optimal plan is for robotic cholecystectomy.  Risks and benefits have been discussed with the patient which include but are not limited to anesthesia, bleeding, infection, biliary ductal injury or stenosis, other associated unanticipated injuries affiliated with laparoscopic surgery.  I believe there is the desire to proceed, interpreter utilized as needed.  Questions elicited and answered to satisfaction.  No guarantees ever expressed or implied.   Face-to-face time spent with the patient and accompanying care providers(if present) was 30 minutes, with more than 50% of the time spent counseling,  educating, and coordinating care of the patient.    These notes generated with voice recognition software. I apologize for typographical errors.  Ronny Bacon M.D., FACS 06/23/2021, 9:22 AM

## 2021-06-23 NOTE — Telephone Encounter (Signed)
Patient has been advised of Pre-Admission date/time, COVID Testing date and Surgery date.  Surgery Date: 07/06/21 Preadmission Testing Date: 06/30/21 (phone 1p-5p) Covid Testing Date: Not needed.    Patient has been made aware to call [phone], between 1-3:00pm the day before surgery, to find out what time to arrive for surgery.

## 2021-06-23 NOTE — H&P (View-Only) (Signed)
Patient ID: Charlotte Hopkins, female   DOB: [birth year], 46 y.o.   MRN: [id number]  Chief Complaint: Right upper quadrant pain  History of Present Illness Charlotte Hopkins is a 46 y.o. female with an increasing history of postprandial right upper quadrant pain.  Classically occurring in the evenings after her greatest meal.  She is not entirely aware of the effect of fats in her diet, but admits she is a [place].  She reports normal bowel activity.  She denies nausea, vomiting, fevers and chills.  Her pain is right upper quadrant/subcostal, it feels like a lot of pressure and swelling, and it radiates to the back.  She is has no history of jaundice or hepatitis.  Past Medical History Past Medical History:  Diagnosis Date   Anxiety    Bipolar disorder ([place])    Cancer ([place]) skin cancer   Chronic back pain    Depression    Personality disorder ([place])    PTSD (post-traumatic stress disorder)       Past Surgical History:  Procedure Laterality Date   PARTIAL HYSTERECTOMY     TEMPOROMANDIBULAR JOINT SURGERY      Allergies  Allergen Reactions   Adhesive [Tape] Rash   Fentanyl    Neosporin [Neomycin-Bacitracin Zn-Polymyx]    Zantac [Ranitidine Hcl]     Current Outpatient Medications  Medication Sig Dispense Refill   ciprofloxacin (CIPRO) 500 MG tablet Take 500 mg by mouth 2 (two) times daily.     metroNIDAZOLE (FLAGYL) 500 MG tablet Take by mouth.     No current facility-administered medications for this visit.    Family History History reviewed. No pertinent family history.    Social History Social History   Tobacco Use   Smoking status: Every Day  Substance Use Topics   Alcohol use: No   Drug use: No        Review of Systems  Constitutional: Negative.   HENT: Negative.    Eyes: Negative.   Respiratory: Negative.    Cardiovascular: Negative.   Gastrointestinal:  Positive for abdominal pain.  Genitourinary: Negative.   Skin: Negative.   Neurological: Negative.    Psychiatric/Behavioral: Negative.       Physical Exam Blood pressure 109/67, pulse 74, temperature 98.2 F (36.8 C), temperature source Oral, height 5\' 7"  (1.702 m), weight 161 lb 6.4 oz (73.2 kg), SpO2 96 %. Last Weight  Most recent update: 06/23/2021  8:56 AM    Weight  73.2 kg (161 lb 6.4 oz)             CONSTITUTIONAL: Well developed, and nourished, appropriately responsive and aware without distress.   EYES: Sclera non-icteric.   EARS, NOSE, MOUTH AND THROAT: Mask worn.    Hearing is intact to voice.  NECK: Trachea is midline, and there is no jugular venous distension.  LYMPH NODES:  Lymph nodes in the neck are not enlarged. RESPIRATORY:  Lungs are clear, and breath sounds are equal bilaterally. Normal respiratory effort without pathologic use of accessory muscles. CARDIOVASCULAR: Heart is regular in rate and rhythm. GI: The abdomen is soft, nontender, and nondistended. There were no palpable masses. I did not appreciate hepatosplenomegaly. There were normal bowel sounds. MUSCULOSKELETAL:  Symmetrical muscle tone appreciated in all four extremities.    SKIN: Skin turgor is normal. No pathologic skin lesions appreciated.  NEUROLOGIC:  Motor and sensation appear grossly normal.  Cranial nerves are grossly without defect. PSYCH:  Alert and oriented to person, place and time. Affect is appropriate  for situation.  Data Reviewed I have personally reviewed what is currently available of the patient's imaging, recent labs and medical records.   Labs:  CBC Latest Ref Rng & Units 09/01/2011  WBC 3.6 - 11.0 x10 3/mm 3 11.2(H)  Hemoglobin 12.0 - 16.0 g/dL 13.6  Hematocrit 35.0 - 47.0 % 41.4  Platelets 150 - 440 x10 3/mm 3 297   CMP Latest Ref Rng & Units 01/24/2012 09/01/2011  Glucose 65 - 99 mg/dL - 100(H)  BUN 7 - 18 mg/dL - 13  Creatinine 0.60 - 1.30 mg/dL 0.53(L) 1.11  Sodium 136 - 145 mmol/L - 138  Potassium 3.5 - 5.1 mmol/L - 3.6  Chloride 98 - 107 mmol/L - 106  CO2 21 - 32  mmol/L - 22  Calcium 8.5 - 10.1 mg/dL - 9.9  Total Protein 6.4 - 8.2 g/dL - 8.4(H)  Total Bilirubin 0.2 - 1.0 mg/dL - 0.7  Alkaline Phos 50 - 136 Unit/L - 73  AST 15 - 37 Unit/L - 16  ALT U/L - 14   Imaging: Radiology review:  CLINICAL DATA:  RIGHT upper quadrant abdominal pain.   EXAM: ULTRASOUND ABDOMEN LIMITED RIGHT UPPER QUADRANT   COMPARISON:  CT AP, 08/12/2006.  US Abdomen report, 09/01/2011.   FINDINGS: Gallbladder:   Multiple echogenic and shadowing gallstones within a nondistended gallbladder, largest measuring up to 1.2 cm. No wall thickening visualized. No sonographic Murphy sign noted by sonographer.   Common bile duct:   Diameter: 0.3 cm   Liver:   No focal lesion identified. Within normal limits in parenchymal echogenicity. Portal vein is patent on color Doppler imaging with normal direction of blood flow towards the liver.   Other: None.   IMPRESSION: Cholelithiasis. No additional sonographic findings to suggest acute cholecystitis.     Electronically Signed   By: Michaelle Birks M.D.   On: 06/17/2021 12:58  Within last 24 hrs: No results found.  Assessment    Chronic calculus cholecystitis with biliary colic  There are no problems to display for this patient.   Plan    Robotic cholecystectomy with ICG imaging.  This was discussed thoroughly.  Optimal plan is for robotic cholecystectomy.  Risks and benefits have been discussed with the patient which include but are not limited to anesthesia, bleeding, infection, biliary ductal injury or stenosis, other associated unanticipated injuries affiliated with laparoscopic surgery.  I believe there is the desire to proceed, interpreter utilized as needed.  Questions elicited and answered to satisfaction.  No guarantees ever expressed or implied.   Face-to-face time spent with the patient and accompanying care providers(if present) was 30 minutes, with more than 50% of the time spent counseling,  educating, and coordinating care of the patient.    These notes generated with voice recognition software. I apologize for typographical errors.  Ronny Bacon M.D., FACS 06/23/2021, 9:22 AM

## 2021-06-23 NOTE — Patient Instructions (Signed)
Our surgery scheduler Pamala Hurry will call you within 24-48 hours to get you scheduled. If you have not heard from her after 48 hours, please call our office. You will not need to get Covid tested before surgery and have the blue sheet available when she calls to write down important information.   If you have any concerns or questions, please feel free to call our office.   Cholelithiasis Cholelithiasis happens when gallstones form in the gallbladder. The gallbladder stores bile. Bile is a fluid that helps digest fats. Bile can harden and form into gallstones. If they cause a blockage, they can cause pain (gallbladder attack). What are the causes? This condition may be caused by: Some blood diseases, such as sickle cell anemia. Too much of a fat-like substance (cholesterol) in your bile. Not enough bile salts in your bile. These salts help the body absorb and digest fats. The gallbladder not emptying fully or often enough. This is common in pregnant women. What increases the risk? The following factors may make you more likely to develop this condition: Being female. Being pregnant many times. Eating a lot of fried foods, fat, and refined carbs (refined carbohydrates). Being very overweight (obese). Being older than age 72. Using medicines with female hormones in them for a long time. Losing weight fast. Having gallstones in your family. Having some health problems, such as diabetes, Crohn's disease, or liver disease. What are the signs or symptoms? Often, there may be gallstones but no symptoms. These gallstones are called silent gallstones. If a gallstone causes a blockage, you may get sudden pain. The pain: Can be in the upper right part of your belly (abdomen). Normally comes at night or after you eat. Can last an hour or more. Can spread to your right shoulder, back, or chest. Can feel like discomfort, burning, or fullness in the upper part of your belly (indigestion). If the  blockage lasts more than a few hours, you can get an infection or swelling. You may: Feel like you may vomit. Vomit. Feel bloated. Have belly pain for 5 hours or more. Feel tender in your belly, often in the upper right part and under your ribs. Have fever or chills. Have skin or the white parts of your eyes turn yellow (jaundice). Have dark pee (urine) or pale poop (stool). How is this treated? Treatment for this condition depends on how bad you feel. If you have symptoms, you may need: Home care, if symptoms are not very bad. Do not eat for 12-24 hours. Drink only water and clear liquids. Start to eat simple or clear foods after 1 or 2 days. Try broths and crackers. You may need medicines for pain or stomach upset or both. If you have an infection, you will need antibiotics. A hospital stay, if you have very bad pain or a very bad infection. Surgery to remove your gallbladder. You may need this if: Gallstones keep coming back. You have very bad symptoms. Medicines to break up gallstones. Medicines: Are best for small gallstones. May be used for up to 6-12 months. A procedure to find and take out gallstones or to break up gallstones. Follow these instructions at home: Medicines Take over-the-counter and prescription medicines only as told by your doctor. If you were prescribed an antibiotic medicine, take it as told by your doctor. Do not stop taking the antibiotic even if you start to feel better. Ask your doctor if the medicine prescribed to you requires you to avoid driving or using machinery.  Eating and drinking Drink enough fluid to keep your urine pale yellow. Drink water or clear fluids. This is important when you have pain. Eat healthy foods. Choose: Fewer fatty foods, such as fried foods. Fewer refined carbs. Avoid breads and grains that are highly processed, such as white bread and white rice. Choose whole grains, such as whole-wheat bread and brown rice. More fiber.  Almonds, fresh fruit, and beans are healthy sources. General instructions Keep a healthy weight. Keep all follow-up visits as told by your doctor. This is important. Where to find more information [employer] of Diabetes and Digestive and Kidney Diseases: [email or web address] Contact a doctor if: You have sudden pain in the upper right part of your belly. Pain might spread to your right shoulder, back, or chest. You have been diagnosed with gallstones that have no symptoms and you get: Belly pain. Discomfort, burning, or fullness in the upper part of your abdomen. You have dark urine or pale stools. Get help right away if: You have sudden pain in the upper right part of your abdomen, and the pain lasts more than 2 hours. You have pain in your abdomen, and: It lasts more than 5 hours. It keeps getting worse. You have a fever or chills. You keep feeling like you may vomit. You keep vomiting. Your skin or the white parts of your eyes turn yellow. Summary Cholelithiasis happens when gallstones form in the gallbladder. This condition may be caused by a blood disease, too much of a fat-like substance in the bile, or not enough bile salts in bile. Treatment for this condition depends on how bad you feel. If you have symptoms, do not eat or drink. You may need medicines. You may need a hospital stay for very bad pain or a very bad infection. You may need surgery if gallstones keep coming back or if you have very bad symptoms. This information is not intended to replace advice given to you by your health care provider. Make sure you discuss any questions you have with your health care provider.  Minimally Invasive Cholecystectomy Minimally invasive cholecystectomy is surgery to remove the gallbladder. The gallbladder is a pear-shaped organ that lies beneath the liver on the right side of the body. The gallbladder stores bile, which is a fluid that helps the body digest fats. Cholecystectomy  is often done to treat inflammation (irritation and swelling) of the gallbladder (cholecystitis). This condition is usually caused by a buildup of gallstones (cholelithiasis) in the gallbladder or when the fluid in the gall bladder becomes stagnant because gallstones get stuck in the ducts (tubes) and block the flow of bile. This can result in inflammation and pain. In severe cases, emergency surgery may be required. This procedure is done through small incisions in the abdomen, instead of one large incision. It is also called laparoscopic surgery. A thin scope with a camera (laparoscope) is inserted through one incision. Then surgical instruments are inserted through the other incisions. In some cases, a minimally invasive surgery may need to be changed to a surgery that is done through a larger incision. This is called open surgery. Tell a health care provider about: Any allergies you have. All medicines you are taking, including vitamins, herbs, eye drops, creams, and over-the-counter medicines. Any problems you or family members have had with anesthetic medicines. Any bleeding problems you have. Any surgeries you have had. Any medical conditions you have. Whether you are pregnant or may be pregnant. What are the risks? Generally, this is a  safe procedure. However, problems may occur, including: Infection. Bleeding. Allergic reactions to medicines. Damage to nearby structures or organs. A gallstone remaining in the common bile duct. The common bile duct carries bile from the gallbladder to the small intestine. A bile leak from the liver or cystic duct after your gallbladder is removed. What happens before the procedure? When to stop eating and drinking Follow instructions from your health care provider about what you may eat and drink before your procedure. These may include: 8 hours before the procedure Stop eating most foods. Do not eat meat, fried foods, or fatty foods. Eat only light  foods, such as toast or crackers. All liquids are okay except energy drinks and alcohol. 6 hours before the procedure Stop eating. Drink only clear liquids, such as water, clear fruit juice, black coffee, plain tea, and sports drinks. Do not drink energy drinks or alcohol. 2 hours before the procedure Stop drinking all liquids. You may be allowed to take medicines with small sips of water. If you do not follow your health care provider's instructions, your procedure may be delayed or canceled. Medicines Ask your health care provider about: Changing or stopping your regular medicines. This is especially important if you are taking diabetes medicines or blood thinners. Taking medicines such as aspirin and ibuprofen. These medicines can thin your blood. Do not take these medicines unless your health care provider tells you to take them. Taking over-the-counter medicines, vitamins, herbs, and supplements. General instructions If you will be going home right after the procedure, plan to have a responsible adult: Take you home from the hospital or clinic. You will not be allowed to drive. Care for you for the time you are told. Do not use any products that contain nicotine or tobacco for at least 4 weeks before the procedure. These products include cigarettes, chewing tobacco, and vaping devices, such as e-cigarettes. If you need help quitting, ask your health care provider. Ask your health care provider: How your surgery site will be marked. What steps will be taken to help prevent infection. These may include: Removing hair at the surgery site. Washing skin with a germ-killing soap. Taking antibiotic medicine. What happens during the procedure?  An IV will be inserted into one of your veins. You will be given one or both of the following: A medicine to help you relax (sedative). A medicine to make you fall asleep (general anesthetic). Your surgeon will make several small incisions in your  abdomen. The laparoscope will be inserted through one of the small incisions. The camera on the laparoscope will send images to a monitor in the operating room. This lets your surgeon see inside your abdomen. A gas will be pumped into your abdomen. This will expand your abdomen to give the surgeon more room to perform the surgery. Other tools that are needed for the procedure will be inserted through the other incisions. The gallbladder will be removed through one of the incisions. Your common bile duct may be examined. If stones are found in the common bile duct, they may be removed. After your gallbladder has been removed, the incisions will be closed with stitches (sutures), staples, or skin glue. Your incisions will be covered with a bandage (dressing). The procedure may vary among health care providers and hospitals. What happens after the procedure? Your blood pressure, heart rate, breathing rate, and blood oxygen level will be monitored until you leave the hospital or clinic. You will be given medicines as needed to control  your pain. You may have a drain placed in the incision. The drain will be removed a day or two after the procedure. Summary Minimally invasive cholecystectomy, also called laparoscopic cholecystectomy, is surgery to remove the gallbladder using small incisions. Tell your health care provider about all the medical conditions you have and all the medicines you are taking for those conditions. Before the procedure, follow instructions about when to stop eating and drinking and changing or stopping medicines. Plan to have a responsible adult care for you for the time you are told after you leave the hospital or clinic. This information is not intended to replace advice given to you by your health care provider. Make sure you discuss any questions you have with your health care provider. Document Revised: 11/02/2020 Document Reviewed: 11/02/2020 Elsevier Patient Education   [place].

## 2021-06-30 ENCOUNTER — Other Ambulatory Visit
Admission: RE | Admit: 2021-06-30 | Discharge: 2021-06-30 | Disposition: A | Discharge status: Home / Self Care | Payer: Medicare Other | Source: Ambulatory Visit | Attending: Surgery | Admitting: Surgery

## 2021-06-30 ENCOUNTER — Other Ambulatory Visit: Payer: Self-pay

## 2021-06-30 HISTORY — DX: Personal history of urinary calculi: Z87.442

## 2021-06-30 HISTORY — DX: Pneumonia, unspecified organism: J18.9

## 2021-06-30 HISTORY — DX: Family history of other specified conditions: Z84.89

## 2021-06-30 HISTORY — DX: Anemia, unspecified: D64.9

## 2021-06-30 NOTE — Patient Instructions (Addendum)
Your procedure is scheduled on: 07/06/21 - Wednesday Report to the Registration Desk on the 1st floor of the [facility]. To find out your arrival time, please call [phone] between 1PM - 3PM on: 07/05/21 - Tuesday  Report to [facility]   REMEMBER: Instructions that are not followed completely may result in serious medical risk, up to and including death; or upon the discretion of your surgeon and anesthesiologist your surgery may need to be rescheduled.  Do not eat food`or drink any fluids after midnight the night before surgery.  No gum chewing, lozengers or hard candies.  TAKE THESE MEDICATIONS THE MORNING OF SURGERY WITH A SIP OF WATER: none  One week prior to surgery: Stop Anti-inflammatories (NSAIDS) such as Advil, Aleve, Ibuprofen, Motrin, Naproxen, Naprosyn and Aspirin based products such as Excedrin, Goodys Powder, BC Powder.  Stop ANY OVER THE COUNTER supplements until after surgery.  You may take Tylenol if needed for pain up until the day of surgery.  No Alcohol for 24 hours before or after surgery.  No Smoking including e-cigarettes for 24 hours prior to surgery.  No chewable tobacco products for at least 6 hours prior to surgery.  No nicotine patches on the day of surgery.  Do not use any "recreational" drugs for at least a week prior to your surgery.  Please be advised that the combination of cocaine and anesthesia may have negative outcomes, up to and including death. If you test positive for cocaine, your surgery will be cancelled.  On the morning of surgery brush your teeth with toothpaste and water, you may rinse your mouth with mouthwash if you wish. Do not swallow any toothpaste or mouthwash.  Use CHG Soap or wipes as directed on instruction sheet.  Do not wear jewelry, make-up, hairpins, clips or nail polish.  Do not wear lotions, powders, or perfumes.   Do not shave body from the neck down 48 hours prior to surgery just in case you cut yourself  which could leave a site for infection.  Also, freshly shaved skin may become irritated if using the CHG soap.  Contact lenses, hearing aids and dentures may not be worn into surgery.  Do not bring valuables to the hospital. [facility] is not responsible for any missing/lost belongings or valuables.   Notify your doctor if there is any change in your medical condition (cold, fever, infection).  Wear comfortable clothing (specific to your surgery type) to the hospital.  After surgery, you can help prevent lung complications by doing breathing exercises.  Take deep breaths and cough every 1-2 hours. Your doctor may order a device called an Incentive Spirometer to help you take deep breaths. When coughing or sneezing, hold a pillow firmly against your incision with both hands. This is called splinting. Doing this helps protect your incision. It also decreases belly discomfort.  If you are being admitted to the hospital overnight, leave your suitcase in the car. After surgery it may be brought to your room.  If you are being discharged the day of surgery, you will not be allowed to drive home. You will need a responsible adult (18 years or older) to drive you home and stay with you that night.   If you are taking public transportation, you will need to have a responsible adult (18 years or older) with you. Please confirm with your physician that it is acceptable to use public transportation.   Please call the [facility] at [phone] if you  have any questions about these instructions.  Surgery Visitation Policy:  Patients undergoing a surgery or procedure may have one family member or support person with them as long as that person is not COVID-19 positive or experiencing its symptoms.  That person may remain in the waiting area during the procedure and may rotate out with other people.  Inpatient Visitation:    Visiting hours are 7 a.m. to 8 p.m. Up to two  visitors ages 16+ are allowed at one time in a patient room. The visitors may rotate out with other people during the day. Visitors must check out when they leave, or other visitors will not be allowed. One designated support person may remain overnight. The visitor must pass COVID-19 screenings, use hand sanitizer when entering and exiting the patients room and wear a mask at all times, including in the patients room. Patients must also wear a mask when staff or their visitor are in the room. Masking is required regardless of vaccination status.

## 2021-07-01 ENCOUNTER — Other Ambulatory Visit
Admission: RE | Admit: 2021-07-01 | Discharge: 2021-07-01 | Disposition: A | Discharge status: Home / Self Care | Payer: Medicare Other | Source: Ambulatory Visit | Attending: Surgery | Admitting: Surgery

## 2021-07-01 ENCOUNTER — Encounter: Payer: Self-pay | Admitting: Urgent Care

## 2021-07-01 DIAGNOSIS — K8010 Calculus of gallbladder with chronic cholecystitis without obstruction: Secondary | ICD-10-CM | POA: Insufficient documentation

## 2021-07-01 LAB — COMPREHENSIVE METABOLIC PANEL
ALT: 13 U/L (ref 0–44)
AST: 16 U/L (ref 15–41)
Albumin: 3.7 g/dL (ref 3.5–5.0)
Alkaline Phosphatase: 42 U/L (ref 38–126)
Anion gap: 7 (ref 5–15)
BUN: 8 mg/dL (ref 6–20)
CO2: 24 mmol/L (ref 22–32)
Calcium: 9.1 mg/dL (ref 8.9–10.3)
Chloride: 105 mmol/L (ref 98–111)
Creatinine, Ser: 0.79 mg/dL (ref 0.44–1.00)
GFR, Estimated: >60 mL/min (ref >60)
Glucose, Bld: 79 mg/dL (ref 70–99)
Potassium: 4 mmol/L (ref 3.5–5.1)
Sodium: 136 mmol/L (ref 135–145)
Total Bilirubin: 0.6 mg/dL (ref 0.3–1.2)
Total Protein: 6.7 g/dL (ref 6.5–8.1)

## 2021-07-01 LAB — CBC WITH DIFFERENTIAL/PLATELET
Abs Immature Granulocytes: 0.04 10*3/uL (ref 0.00–0.07)
Basophils Absolute: 0.1 10*3/uL (ref 0.0–0.1)
Basophils Relative: 1 %
Eosinophils Absolute: 0.2 10*3/uL (ref 0.0–0.5)
Eosinophils Relative: 2 %
HCT: 41 % (ref 36.0–46.0)
Hemoglobin: 13.7 g/dL (ref 12.0–15.0)
Immature Granulocytes: 0 %
Lymphocytes Relative: 27 %
Lymphs Abs: 2.8 10*3/uL (ref 0.7–4.0)
MCH: 30.9 pg (ref 26.0–34.0)
MCHC: 33.4 g/dL (ref 30.0–36.0)
MCV: 92.3 fL (ref 80.0–100.0)
Monocytes Absolute: 0.9 10*3/uL (ref 0.1–1.0)
Monocytes Relative: 8 %
Neutro Abs: 6.4 10*3/uL (ref 1.7–7.7)
Neutrophils Relative %: 62 %
Platelets: 248 10*3/uL (ref 150–400)
RBC: 4.44 MIL/uL (ref 3.87–5.11)
RDW: 12.7 % (ref 11.5–15.5)
WBC: 10.4 10*3/uL (ref 4.0–10.5)
nRBC: 0 % (ref 0.0–0.2)

## 2021-07-06 ENCOUNTER — Encounter
Admission: RE | Disposition: A | Discharge status: Home / Self Care | Payer: Self-pay | Source: Home / Self Care | Attending: Surgery

## 2021-07-06 ENCOUNTER — Ambulatory Visit: Payer: Medicare Other | Admitting: Anesthesiology

## 2021-07-06 ENCOUNTER — Encounter: Payer: Self-pay | Admitting: Surgery

## 2021-07-06 ENCOUNTER — Other Ambulatory Visit: Payer: Self-pay

## 2021-07-06 ENCOUNTER — Telehealth: Payer: Self-pay | Admitting: *Deleted

## 2021-07-06 ENCOUNTER — Other Ambulatory Visit: Payer: Self-pay | Admitting: Surgery

## 2021-07-06 ENCOUNTER — Ambulatory Visit
Admission: RE | Admit: 2021-07-06 | Discharge: 2021-07-06 | Disposition: A | Discharge status: Home / Self Care | Payer: Medicare Other | Attending: Surgery | Admitting: Surgery

## 2021-07-06 DIAGNOSIS — F17210 Nicotine dependence, cigarettes, uncomplicated: Secondary | ICD-10-CM | POA: Insufficient documentation

## 2021-07-06 DIAGNOSIS — K8060 Calculus of gallbladder and bile duct with cholecystitis, unspecified, without obstruction: Secondary | ICD-10-CM | POA: Insufficient documentation

## 2021-07-06 DIAGNOSIS — K8010 Calculus of gallbladder with chronic cholecystitis without obstruction: Secondary | ICD-10-CM | POA: Diagnosis not present

## 2021-07-06 DIAGNOSIS — R1011 Right upper quadrant pain: Secondary | ICD-10-CM | POA: Diagnosis present

## 2021-07-06 SURGERY — CHOLECYSTECTOMY, ROBOT-ASSISTED, LAPAROSCOPIC
Anesthesia: General

## 2021-07-06 MED ORDER — BUPIVACAINE LIPOSOME 1.3 % IJ SUSP: 20.0000 mL | Freq: Once | INTRAMUSCULAR | Status: DC

## 2021-07-06 MED ORDER — HYDROMORPHONE HCL 1 MG/ML IJ SOLN
INTRAMUSCULAR | Status: AC
  Filled 2021-07-06: qty 1

## 2021-07-06 MED ORDER — ROCURONIUM BROMIDE 10 MG/ML (PF) SYRINGE
PREFILLED_SYRINGE | INTRAVENOUS | Status: AC
  Filled 2021-07-06: qty 10

## 2021-07-06 MED ORDER — ACETAMINOPHEN 10 MG/ML IV SOLN: 1000.0000 mg | Freq: Once | INTRAVENOUS | Status: DC | PRN

## 2021-07-06 MED ORDER — OXYCODONE HCL 5 MG/5ML PO SOLN: 5.0000 mg | Freq: Once | ORAL | Status: DC | PRN

## 2021-07-06 MED ORDER — PROPOFOL 500 MG/50ML IV EMUL
INTRAVENOUS | Status: DC | PRN
Start: 2021-07-06 — End: 2021-07-06
  Administered 2021-07-06: 50 ug/kg/min via INTRAVENOUS

## 2021-07-06 MED ORDER — MIDAZOLAM HCL 2 MG/2ML IJ SOLN
INTRAMUSCULAR | Status: DC | PRN
Start: 2021-07-06 — End: 2021-07-06
  Administered 2021-07-06 (×2): 2 mg via INTRAVENOUS

## 2021-07-06 MED ORDER — GABAPENTIN 300 MG PO CAPS: 300.0000 mg | ORAL_CAPSULE | ORAL | Status: AC

## 2021-07-06 MED ORDER — ROCURONIUM BROMIDE 100 MG/10ML IV SOLN
INTRAVENOUS | Status: DC | PRN
  Administered 2021-07-06: 50 mg via INTRAVENOUS

## 2021-07-06 MED ORDER — CELECOXIB 200 MG PO CAPS: 200.0000 mg | ORAL_CAPSULE | ORAL | Status: AC

## 2021-07-06 MED ORDER — PROPOFOL 10 MG/ML IV BOLUS
INTRAVENOUS | Status: DC | PRN
Start: 2021-07-06 — End: 2021-07-06
  Administered 2021-07-06: 200 mg via INTRAVENOUS

## 2021-07-06 MED ORDER — PHENYLEPHRINE HCL (PRESSORS) 10 MG/ML IV SOLN
INTRAVENOUS | Status: DC | PRN
  Administered 2021-07-06: 160 ug via INTRAVENOUS

## 2021-07-06 MED ORDER — CHLORHEXIDINE GLUCONATE CLOTH 2 % EX PADS: 6.0000 | MEDICATED_PAD | Freq: Once | CUTANEOUS | Status: DC

## 2021-07-06 MED ORDER — LACTATED RINGERS IV SOLN: INTRAVENOUS | Status: DC

## 2021-07-06 MED ORDER — SUGAMMADEX SODIUM 500 MG/5ML IV SOLN
INTRAVENOUS | Status: DC | PRN
  Administered 2021-07-06: 400 mg via INTRAVENOUS

## 2021-07-06 MED ORDER — ACETAMINOPHEN 500 MG PO TABS: 1000.0000 mg | ORAL_TABLET | ORAL | Status: AC

## 2021-07-06 MED ORDER — ACETAMINOPHEN 500 MG PO TABS
ORAL_TABLET | ORAL | Status: AC
  Administered 2021-07-06: 1000 mg via ORAL
  Filled 2021-07-06: qty 2

## 2021-07-06 MED ORDER — ORAL CARE MOUTH RINSE: 15.0000 mL | Freq: Once | OROMUCOSAL | Status: AC

## 2021-07-06 MED ORDER — BUPIVACAINE-EPINEPHRINE (PF) 0.25% -1:200000 IJ SOLN
INTRAMUSCULAR | Status: AC
  Filled 2021-07-06: qty 30

## 2021-07-06 MED ORDER — BUPIVACAINE-EPINEPHRINE (PF) 0.25% -1:200000 IJ SOLN
INTRAMUSCULAR | Status: DC | PRN
  Administered 2021-07-06: 30 mL

## 2021-07-06 MED ORDER — OXYCODONE HCL 5 MG PO TABS: 5.0000 mg | ORAL_TABLET | Freq: Once | ORAL | Status: DC | PRN

## 2021-07-06 MED ORDER — MIDAZOLAM HCL 2 MG/2ML IJ SOLN
INTRAMUSCULAR | Status: AC
  Filled 2021-07-06: qty 2

## 2021-07-06 MED ORDER — ONDANSETRON HCL 4 MG/2ML IJ SOLN
INTRAMUSCULAR | Status: DC | PRN
  Administered 2021-07-06: 4 mg via INTRAVENOUS

## 2021-07-06 MED ORDER — CEFAZOLIN SODIUM-DEXTROSE 2-4 GM/100ML-% IV SOLN
INTRAVENOUS | Status: AC
  Filled 2021-07-06: qty 100

## 2021-07-06 MED ORDER — HYDROCODONE-ACETAMINOPHEN 5-325 MG PO TABS: 1.0000 | ORAL_TABLET | Freq: Four times a day (QID) | ORAL | 0 refills | Status: DC | PRN

## 2021-07-06 MED ORDER — IBUPROFEN 800 MG PO TABS: 800.0000 mg | ORAL_TABLET | Freq: Three times a day (TID) | ORAL | 0 refills | Status: DC | PRN

## 2021-07-06 MED ORDER — GABAPENTIN 300 MG PO CAPS
ORAL_CAPSULE | ORAL | Status: AC
  Administered 2021-07-06: 300 mg via ORAL
  Filled 2021-07-06: qty 1

## 2021-07-06 MED ORDER — DEXAMETHASONE SODIUM PHOSPHATE 10 MG/ML IJ SOLN
INTRAMUSCULAR | Status: DC | PRN
  Administered 2021-07-06: 10 mg via INTRAVENOUS

## 2021-07-06 MED ORDER — CELECOXIB 200 MG PO CAPS
ORAL_CAPSULE | ORAL | Status: AC
  Administered 2021-07-06: 200 mg via ORAL
  Filled 2021-07-06: qty 1

## 2021-07-06 MED ORDER — DEXAMETHASONE SODIUM PHOSPHATE 10 MG/ML IJ SOLN
INTRAMUSCULAR | Status: AC
Start: 2021-07-06 — End: ?
  Filled 2021-07-06: qty 1

## 2021-07-06 MED ORDER — SUGAMMADEX SODIUM 500 MG/5ML IV SOLN
INTRAVENOUS | Status: AC
  Filled 2021-07-06: qty 5

## 2021-07-06 MED ORDER — CHLORHEXIDINE GLUCONATE 0.12 % MT SOLN
OROMUCOSAL | Status: AC
  Administered 2021-07-06: 15 mL via OROMUCOSAL
  Filled 2021-07-06: qty 15

## 2021-07-06 MED ORDER — INDOCYANINE GREEN 25 MG IV SOLR
2.5000 mg | Freq: Once | INTRAVENOUS | Status: AC
  Administered 2021-07-06: 2.5 mg via INTRAVENOUS
  Filled 2021-07-06: qty 1

## 2021-07-06 MED ORDER — LIDOCAINE HCL (CARDIAC) PF 100 MG/5ML IV SOSY
PREFILLED_SYRINGE | INTRAVENOUS | Status: DC | PRN
  Administered 2021-07-06: 100 mg via INTRAVENOUS

## 2021-07-06 MED ORDER — CHLORHEXIDINE GLUCONATE 0.12 % MT SOLN: 15.0000 mL | Freq: Once | OROMUCOSAL | Status: AC

## 2021-07-06 MED ORDER — HYDROMORPHONE HCL 1 MG/ML IJ SOLN
INTRAMUSCULAR | Status: DC | PRN
  Administered 2021-07-06 (×4): 1 mg via INTRAVENOUS

## 2021-07-06 MED ORDER — CEFAZOLIN SODIUM-DEXTROSE 2-4 GM/100ML-% IV SOLN
2.0000 g | INTRAVENOUS | Status: AC
  Administered 2021-07-06: 2 g via INTRAVENOUS

## 2021-07-06 MED ORDER — ONDANSETRON HCL 4 MG/2ML IJ SOLN
INTRAMUSCULAR | Status: AC
  Filled 2021-07-06: qty 2

## 2021-07-06 MED ORDER — ONDANSETRON HCL 4 MG/2ML IJ SOLN: 4.0000 mg | Freq: Once | INTRAMUSCULAR | Status: DC | PRN

## 2021-07-06 MED ORDER — PROPOFOL 500 MG/50ML IV EMUL
INTRAVENOUS | Status: AC
  Filled 2021-07-06: qty 50

## 2021-07-06 MED ORDER — PROPOFOL 10 MG/ML IV BOLUS
INTRAVENOUS | Status: AC
  Filled 2021-07-06: qty 20

## 2021-07-06 SURGICAL SUPPLY — 44 items
CANNULA CAP OBTURATR AIRSEAL 8 (CAP) ×2 IMPLANT
CLIP LIGATING HEM O LOK PURPLE (MISCELLANEOUS) ×2 IMPLANT
COVER TIP SHEARS 8 DVNC (MISCELLANEOUS) ×1 IMPLANT
COVER TIP SHEARS 8MM DA VINCI (MISCELLANEOUS) ×2
DERMABOND ADVANCED (GAUZE/BANDAGES/DRESSINGS) ×1
DERMABOND ADVANCED .7 DNX12 (GAUZE/BANDAGES/DRESSINGS) ×1 IMPLANT
DRAPE ARM DVNC X/XI (DISPOSABLE) ×4 IMPLANT
DRAPE COLUMN DVNC XI (DISPOSABLE) ×1 IMPLANT
DRAPE DA VINCI XI ARM (DISPOSABLE) ×8
DRAPE DA VINCI XI COLUMN (DISPOSABLE) ×2
ELECT CAUTERY BLADE 6.4 (BLADE) ×2 IMPLANT
GLOVE SURG ORTHO LTX SZ7.5 (GLOVE) ×8 IMPLANT
GOWN STRL REUS W/ TWL LRG LVL3 (GOWN DISPOSABLE) ×4 IMPLANT
GOWN STRL REUS W/TWL LRG LVL3 (GOWN DISPOSABLE) ×8
GRASPER SUT TROCAR 14GX15 (MISCELLANEOUS) IMPLANT
INFUSOR MANOMETER BAG 3000ML (MISCELLANEOUS) IMPLANT
IRRIGATION STRYKERFLOW (MISCELLANEOUS) IMPLANT
IRRIGATOR STRYKERFLOW (MISCELLANEOUS)
IRRIGATOR SUCT 8 DISP DVNC XI (IRRIGATION / IRRIGATOR) IMPLANT
IRRIGATOR SUCTION 8MM XI DISP (IRRIGATION / IRRIGATOR)
IV NS IRRIG 3000ML ARTHROMATIC (IV SOLUTION) IMPLANT
KIT PINK PAD W/HEAD ARE REST (MISCELLANEOUS) ×2
KIT PINK PAD W/HEAD ARM REST (MISCELLANEOUS) ×1 IMPLANT
KIT TURNOVER KIT A (KITS) ×2 IMPLANT
LABEL OR SOLS (LABEL) ×2 IMPLANT
MANIFOLD NEPTUNE II (INSTRUMENTS) ×2 IMPLANT
NDL INSUFFLATION 14GA 120MM (NEEDLE) IMPLANT
NEEDLE HYPO 22GX1.5 SAFETY (NEEDLE) ×2 IMPLANT
NEEDLE INSUFFLATION 14GA 120MM (NEEDLE) ×2 IMPLANT
NS IRRIG 500ML POUR BTL (IV SOLUTION) ×2 IMPLANT
PACK LAP CHOLECYSTECTOMY (MISCELLANEOUS) ×2 IMPLANT
PENCIL ELECTRO HAND CTR (MISCELLANEOUS) ×2 IMPLANT
POUCH SPECIMEN RETRIEVAL 10MM (ENDOMECHANICALS) ×2 IMPLANT
SEAL CANN UNIV 5-8 DVNC XI (MISCELLANEOUS) ×3 IMPLANT
SEAL XI 5MM-8MM UNIVERSAL (MISCELLANEOUS) ×6
SET TUBE FILTERED XL AIRSEAL (SET/KITS/TRAYS/PACK) ×2 IMPLANT
SOLUTION ELECTROLUBE (MISCELLANEOUS) ×2 IMPLANT
SPIKE FLUID TRANSFER (MISCELLANEOUS) ×2 IMPLANT
SUT MNCRL 4-0 (SUTURE) ×2
SUT MNCRL 4-0 27XMFL (SUTURE) ×1
SUT VICRYL 0 AB UR-6 (SUTURE) ×2 IMPLANT
SUTURE MNCRL 4-0 27XMF (SUTURE) ×1 IMPLANT
TROCAR Z-THREAD FIOS 11X100 BL (TROCAR) ×1 IMPLANT
WATER STERILE IRR 500ML POUR (IV SOLUTION) ×2 IMPLANT

## 2021-07-06 NOTE — Op Note (Signed)
Robotic cholecystectomy with Indocyamine Green Ductal Imaging.   Pre-operative Diagnosis: Chronic calculus cholecystitis  Post-operative Diagnosis:  Same.  Procedure: Robotic assisted laparoscopic cholecystectomy with Indocyamine Green Ductal Imaging.   Surgeon: Ronny Bacon, M.D., FACS  Anesthesia: General. with endotracheal tube  Findings: As expected minimal scarring.  Estimated Blood Loss: 15 mL         Drains: None         Specimens: Gallbladder           Complications: none  Procedure Details  The patient was seen again in the Holding Room.  2.5 mg dose of ICG was administered intravenously.   The benefits, complications, treatment options, risks and expected outcomes were again reviewed with the patient. The likelihood of improving the patient's symptoms with return to their baseline status is good.  The patient and/or family concurred with the proposed plan, giving informed consent, again alternatives reviewed.  The patient was taken to Operating Room, identified, and the procedure verified as robotic assisted laparoscopic cholecystectomy.  Prior to the induction of general anesthesia, antibiotic prophylaxis was administered. VTE prophylaxis was in place. General endotracheal anesthesia was then administered and tolerated well. The patient was positioned in the supine position.  After the induction, the abdomen was prepped with Chloraprep and draped in the sterile fashion.  A Time Out was held and the above information confirmed.  After local infiltration of quarter percent Marcaine with epinephrine, stab incision was made left upper quadrant.  Just below the costal margin at Palmer's point, approximately midclavicular line the Veres needle is passed with sensation of the layers to penetrate the abdominal wall and into the peritoneum.  Saline drop test is confirmed peritoneal placement.  Insufflation is initiated with carbon dioxide to pressures of 15 mmHg.  Right  infra-umbilical local infiltration with quarter percent Marcaine with epinephrine is utilized.  Made a 12 mm incision on the right periumbilical site, I advanced an optical 43mm port under direct visualization into the peritoneal cavity.  Once the peritoneum was penetrated, insufflation was initiated.  The trocar was then advanced into the abdominal cavity under direct visualization. Pneumoperitoneum was then continued with Air seal utilizing CO2 at 15 mmHg or less and tolerated well without any adverse changes in the patient's vital signs.  Two 8.5-mm ports were placed in the left lower quadrant and laterally, and one to the right lower quadrant, all under direct vision. All skin incisions  were infiltrated with a local anesthetic agent before making the incision and placing the trocars.  The patient was positioned  in reverse Trendelenburg, tilted the patient's left side down.  Da Vinci XI robot was then positioned on to the patient's left side, and docked.  The gallbladder was identified, the fundus grasped via the arm 4 Prograsp and retracted cephalad. Adhesions were lysed with scissors and cautery.  The infundibulum was identified grasped and retracted laterally, exposing the peritoneum overlying the triangle of Calot. This was then opened and dissected using cautery & scissors. An extended critical view of the cystic duct and cystic artery was obtained, aided by the ICG via FireFly which enabled ready visualization of the ductal anatomy.    The cystic duct was clearly identified and dissected to isolation.   Artery well isolated and clipped, and the cystic duct was triple clipped and divided with scissors, as close to the gallbladder neck as feasible, thus leaving two on the remaining stump.  The specimen side of the artery is sealed with bipolar and divided  with monopolar scissors.   The gallbladder was taken from the gallbladder fossa in a retrograde fashion with the electrocautery. The gallbladder  was removed and placed in an Endocatch bag.  The liver bed is inspected. Hemostasis was confirmed.  The robot was undocked and moved away from the operative field. No irrigation was utilized. The gallbladder and Endocatch sac were then removed through the infraumbilical port site.   Inspection of the right upper quadrant was performed. No bleeding, bile duct injury or leak, or bowel injury was noted. The infra-umbilical port site fascia was closed with interrumpted 0 Vicryl sutures using PMI/cone under direct visualization. Pneumoperitoneum was released and ports removed.  4-0 subcuticular Monocryl was used to close the skin. Dermabond was  applied.  The patient was then extubated and brought to the recovery room in stable condition. Sponge, lap, and needle counts were correct at closure and at the conclusion of the case.               Ronny Bacon, M.D., [facility] 07/06/2021 2:12 PM

## 2021-07-06 NOTE — Telephone Encounter (Signed)
Notified Dr Christian Mate of this. Spoke with the patient and she said he could send the prescription to [facility] in [place].

## 2021-07-06 NOTE — Anesthesia Procedure Notes (Signed)
Procedure Name: Intubation Date/Time: 07/06/2021 1:14 PM Performed by: Arita Miss, MD Pre-anesthesia Checklist: Patient identified, Patient being monitored, Timeout performed, Emergency Drugs available and Suction available Patient Re-evaluated:Patient Re-evaluated prior to induction Oxygen Delivery Method: Circle system utilized Preoxygenation: Pre-oxygenation with 100% oxygen Induction Type: IV induction Ventilation: Mask ventilation without difficulty Laryngoscope Size: Miller and 2 Grade View: Grade I Tube type: Oral Tube size: 6.5 mm Number of attempts: 1 Placement Confirmation: ETT inserted through vocal cords under direct vision, positive ETCO2 and breath sounds checked- equal and bilateral Secured at: 21 cm Tube secured with: Tape Dental Injury: Teeth and Oropharynx as per pre-operative assessment

## 2021-07-06 NOTE — Transfer of Care (Signed)
Immediate Anesthesia Transfer of Care Note  Patient: Charlotte Hopkins  Procedure(s) Performed: XI ROBOTIC ASSISTED LAPAROSCOPIC CHOLECYSTECTOMY INDOCYANINE GREEN FLUORESCENCE IMAGING (ICG)  Patient Location: PACU  Anesthesia Type:General  Level of Consciousness: awake, drowsy and patient cooperative  Airway & Oxygen Therapy: Patient Spontanous Breathing and Patient connected to nasal cannula oxygen  Post-op Assessment: Report given to RN and Post -op Vital signs reviewed and stable  Post vital signs: Reviewed and stable  Last Vitals:  Vitals Value Taken Time  BP 118/46 07/06/21 1350  Temp    Pulse 106 07/06/21 1353  Resp 17 07/06/21 1353  SpO2 95 % 07/06/21 1353  Vitals shown include unvalidated device data.  Last Pain:  Vitals:   07/06/21 1202  TempSrc: Oral         Complications: No notable events documented.

## 2021-07-06 NOTE — Telephone Encounter (Signed)
Patient called and stated that she had surgery today 07/06/21 Dr Christian Mate xi robotic assisted laparoscopic cholecystectomy and she was given hydrocodone but according to her pharmacy its on hold. She uses [facility] in [place] .She wants to know if we can call in something else.

## 2021-07-06 NOTE — Anesthesia Postprocedure Evaluation (Signed)
Anesthesia Post Note  Patient: Charlotte Hopkins  Procedure(s) Performed: XI ROBOTIC ASSISTED LAPAROSCOPIC CHOLECYSTECTOMY INDOCYANINE GREEN FLUORESCENCE IMAGING (ICG)  Patient location during evaluation: PACU Anesthesia Type: General Level of consciousness: awake and alert Pain management: pain level controlled Vital Signs Assessment: post-procedure vital signs reviewed and stable Respiratory status: spontaneous breathing, nonlabored ventilation, respiratory function stable and patient connected to nasal cannula oxygen Cardiovascular status: blood pressure returned to baseline and stable Postop Assessment: no apparent nausea or vomiting Anesthetic complications: no   No notable events documented.   Last Vitals:  Vitals:   07/06/21 1420 07/06/21 1431  BP:  (!) 116/59  Pulse: 90 87  Resp: 13 16  Temp: (!) 36.2 C 36.6 C  SpO2: 93% 92%    Last Pain:  Vitals:   07/06/21 1431  TempSrc: Temporal                 Arita Miss

## 2021-07-06 NOTE — Discharge Instructions (Signed)
AMBULATORY SURGERY  DISCHARGE INSTRUCTIONS   The drugs that you were given will stay in your system until tomorrow so for the next 24 hours you should not:  Drive an automobile Make any legal decisions Drink any alcoholic beverage   You may resume regular meals tomorrow.  Today it is better to start with liquids and gradually work up to solid foods.  You may eat anything you prefer, but it is better to start with liquids, then soup and crackers, and gradually work up to solid foods.   Please notify your doctor immediately if you have any unusual bleeding, trouble breathing, redness and pain at the surgery site, drainage, fever, or pain not relieved by medication.       Please contact your physician with any problems or Same Day Surgery at [phone], Monday through Friday 6 am to 4 pm, or [facility] number at [phone].  

## 2021-07-06 NOTE — Interval H&P Note (Signed)
History and Physical Interval Note:  07/06/2021 12:16 PM  Charlotte Hopkins  has presented today for surgery, with the diagnosis of chronic calculous cholecystitis.  The various methods of treatment have been discussed with the patient and family. After consideration of risks, benefits and other options for treatment, the patient has consented to  Procedure(s): XI ROBOTIC ASSISTED LAPAROSCOPIC CHOLECYSTECTOMY (N/A) [place] (ICG) (N/A) as a surgical intervention.  The patient's history has been reviewed, patient examined, no change in status, stable for surgery.  I have reviewed the patient's chart and labs.  Questions were answered to the patient's satisfaction.     Ronny Bacon

## 2021-07-06 NOTE — Anesthesia Preprocedure Evaluation (Signed)
Anesthesia Evaluation  Patient identified by MRN, date of birth, ID band Patient awake    Reviewed: Allergy & Precautions, NPO status , Patient's Chart, lab work & pertinent test results  History of Anesthesia Complications (+) Family history of anesthesia reaction and history of anesthetic complications  Airway Mallampati: III  TM Distance: >3 FB Neck ROM: full    Dental  (+) Missing   Pulmonary neg shortness of breath, pneumonia, Current Smoker,     + decreased breath sounds      Cardiovascular Exercise Tolerance: Good (-) angina(-) Past MI negative cardio ROS Normal cardiovascular exam     Neuro/Psych PSYCHIATRIC DISORDERS negative neurological ROS     GI/Hepatic negative GI ROS, Neg liver ROS, neg GERD  ,  Endo/Other  negative endocrine ROS  Renal/GU      Musculoskeletal   Abdominal   Peds  Hematology negative hematology ROS (+)   Anesthesia Other Findings Past Medical History: No date: Anemia No date: Anxiety No date: Bipolar disorder ([place]) skin cancer: Cancer ([place]) No date: Chronic back pain No date: Depression No date: Family history of adverse reaction to anesthesia     Comment:  mom had n/v No date: History of kidney stones No date: Personality disorder ([place]) No date: Pneumonia No date: PTSD (post-traumatic stress disorder)  Past Surgical History: No date: FRACTURE SURGERY     Comment:  ankle right No date: PARTIAL HYSTERECTOMY No date: TEMPOROMANDIBULAR JOINT SURGERY No date: WISDOM TOOTH EXTRACTION  BMI    Body Mass Index: 25.28 kg/m      Reproductive/Obstetrics negative OB ROS                             Anesthesia Physical Anesthesia Plan  ASA: 3  Anesthesia Plan: General ETT   Post-op Pain Management:    Induction: Intravenous  PONV Risk Score and Plan: Ondansetron, Dexamethasone, Midazolam and Treatment may vary due to age or medical  condition  Airway Management Planned: Oral ETT  Additional Equipment:   Intra-op Plan:   Post-operative Plan: Extubation in OR  Informed Consent: I have reviewed the patients History and Physical, chart, labs and discussed the procedure including the risks, benefits and alternatives for the proposed anesthesia with the patient or authorized representative who has indicated his/her understanding and acceptance.     Dental Advisory Given  Plan Discussed with: Anesthesiologist, CRNA and Surgeon  Anesthesia Plan Comments: (Patient consented for risks of anesthesia including but not limited to:  - adverse reactions to medications - damage to eyes, teeth, lips or other oral mucosa - nerve damage due to positioning  - sore throat or hoarseness - Damage to heart, brain, nerves, lungs, other parts of body or loss of life  Patient voiced understanding.)        Anesthesia Quick Evaluation

## 2021-07-07 LAB — SURGICAL PATHOLOGY

## 2021-07-21 ENCOUNTER — Ambulatory Visit (INDEPENDENT_AMBULATORY_CARE_PROVIDER_SITE_OTHER): Payer: Medicare Other | Admitting: Surgery

## 2021-07-21 ENCOUNTER — Encounter: Payer: Self-pay | Admitting: Surgery

## 2021-07-21 ENCOUNTER — Other Ambulatory Visit: Payer: Self-pay

## 2021-07-21 VITALS — BP 103/70 | HR 79 | Temp 97.8°F | Ht 67.0 in | Wt 160.2 lb

## 2021-07-21 DIAGNOSIS — Z09 Encounter for follow-up examination after completed treatment for conditions other than malignant neoplasm: Secondary | ICD-10-CM

## 2021-07-21 DIAGNOSIS — K8010 Calculus of gallbladder with chronic cholecystitis without obstruction: Secondary | ICD-10-CM

## 2021-07-21 DIAGNOSIS — Z9049 Acquired absence of other specified parts of digestive tract: Secondary | ICD-10-CM | POA: Insufficient documentation

## 2021-07-21 NOTE — Progress Notes (Signed)
[facility] ?POST-OP OFFICE VISIT ? ?07/21/2021 ? ?HPI: ?Charlotte Hopkins is a 46 y.o. female 15 days s/p robotic cholecystectomy.  She reports having normal bowel activity, denies nausea, vomiting, fevers or chills.  Expected peri-incisional tenderness, but denies pain.  She looks great ? ?Vital signs: ?BP 103/70   Pulse 79   Temp 97.8 ?F (36.6 ?C) (Oral)   Ht '5\' 7"'$  (1.702 m)   Wt 160 lb 3.2 oz (72.7 kg)   SpO2 99%   BMI 25.09 kg/m?   ? ?Physical Exam: ?Constitutional: She appears well.  Quite relieved to be past it. ?Abdomen: Soft nontender. ?Skin: Incisions are clean dry and intact. ? ?Assessment/Plan: ?This is a 46 y.o. female 15 days s/p robotic cholecystectomy. ? ?Patient Active Problem List  ? Diagnosis Date Noted  ? CCC (chronic calculous cholecystitis) 06/23/2021  ? ? -She may follow-up as needed.  Ensured her that we are readily available should any new questions or issues arise. ? ? ?Charlotte Hopkins M.D., FACS ?07/21/2021, 9:37 AM  ?

## 2021-07-21 NOTE — Patient Instructions (Addendum)
If you have any concerns or questions, please feel free to call our office. Follow up as needed.  ? ?GENERAL POST-OPERATIVE ?PATIENT INSTRUCTIONS  ? ?WOUND CARE INSTRUCTIONS:  Keep a dry clean dressing on the wound if there is drainage. The initial bandage may be removed after 24 hours.  Once the wound has quit draining you may leave it open to air.  If clothing rubs against the wound or causes irritation and the wound is not draining you may cover it with a dry dressing during the daytime.  Try to keep the wound dry and avoid ointments on the wound unless directed to do so.  If the wound becomes bright red and painful or starts to drain infected material that is not clear, please contact your physician immediately.  If the wound is mildly pink and has a thick firm ridge underneath it, this is normal, and is referred to as a healing ridge.  This will resolve over the next 4-6 weeks. ? ?BATHING: ?You may shower if you have been informed of this by your surgeon. However, Please do not submerge in a tub, hot tub, or pool until incisions are completely sealed or have been told by your surgeon that you may do so. ? ?DIET:  You may eat any foods that you can tolerate.  It is a good idea to eat a high fiber diet and take in plenty of fluids to prevent constipation.  If you do become constipated you may want to take a mild laxative or take ducolax tablets on a daily basis until your bowel habits are regular.  Constipation can be very uncomfortable, along with straining, after recent surgery. ? ?ACTIVITY:  You are encouraged to cough and deep breath or use your incentive spirometer if you were given one, every 15-30 minutes when awake.  This will help prevent respiratory complications and low grade fevers post-operatively if you had a general anesthetic.  You may want to hug a pillow when coughing and sneezing to add additional support to the surgical area, if you had abdominal or chest surgery, which will decrease pain  during these times.  You are encouraged to walk and engage in light activity for the next two weeks.  You should not lift, push or pull more than 15-20 pounds, until 08/03/2021 as it could put you at increased risk for complications.  Twenty pounds is roughly equivalent to a plastic bag of groceries. At that time- Listen to your body when lifting, if you have pain when lifting, stop and then try again in a few days. Soreness after doing exercises or activities of daily living is normal as you get back in to your normal routine. ? ?MEDICATIONS:  Try to take narcotic medications and anti-inflammatory medications, such as tylenol, ibuprofen, naprosyn, etc., with food.  This will minimize stomach upset from the medication.  Should you develop nausea and vomiting from the pain medication, or develop a rash, please discontinue the medication and contact your physician.  You should not drive, make important decisions, or operate machinery when taking narcotic pain medication. ? ?SUNBLOCK ?Use sun block to incision area over the next year if this area will be exposed to sun. This helps decrease scarring and will allow you avoid a permanent darkened area over your incision. ? ?QUESTIONS:  Please feel free to call our office if you have any questions, and we will be glad to assist you.  ? ? ?Gallbladder Eating Plan ?If you have a gallbladder condition, you  may have trouble digesting fats. Eating a low-fat diet can help reduce your symptoms, and may be helpful before and after having surgery to remove your gallbladder (cholecystectomy). Your health care provider may recommend that you work with a diet and nutrition specialist (dietitian) to help you reduce the amount of fat in your diet. ?What are tips for following this plan? ?General guidelines ?Limit your fat intake to less than 30% of your total daily calories. If you eat around 1,800 calories each day, this is less than 60 grams (g) of fat per day. ?Fat is an important  part of a healthy diet. Eating a low-fat diet can make it hard to maintain a healthy body weight. Ask your dietitian how much fat, calories, and other nutrients you need each day. ?Eat small, frequent meals throughout the day instead of three large meals. ?Drink at least 8-10 cups of fluid a day. Drink enough fluid to keep your urine clear or pale yellow. ?Limit alcohol intake to no more than 1 drink a day for nonpregnant women and 2 drinks a day for men. One drink equals 12 oz of beer, 5 oz of wine, or 1? oz of hard liquor. ?Reading food labels ? ?Check Nutrition Facts on food labels for the amount of fat per serving. Choose foods with less than 3 grams of fat per serving. ?Shopping ?Choose nonfat and low-fat healthy foods. Look for the words "nonfat," "low fat," or "fat free." ?Avoid buying processed or prepackaged foods. ?Cooking ?Cook using low-fat methods, such as baking, broiling, grilling, or boiling. ?Cook with small amounts of healthy fats, such as olive oil, grapeseed oil, canola oil, or sunflower oil. ?What foods are recommended? ?All fresh, frozen, or canned fruits and vegetables. ?Whole grains. ?Low-fat or non-fat (skim) milk and yogurt. ?Lean meat, skinless poultry, fish, eggs, and beans. ?Low-fat protein supplement powders or drinks. ?Spices and herbs. ?What foods are not recommended? ?High-fat foods. These include baked goods, fast food, fatty cuts of meat, ice cream, french toast, sweet rolls, pizza, cheese bread, foods covered with butter, creamy sauces, or cheese. ?Fried foods. These include french fries, tempura, battered fish, breaded chicken, fried breads, and sweets. ?Foods with strong odors. ?Foods that cause bloating and gas. ?Summary ?A low-fat diet can be helpful if you have a gallbladder condition, or before and after gallbladder surgery. ?Limit your fat intake to less than 30% of your total daily calories. This is about 60 g of fat if you eat 1,800 calories each day. ?Eat small, frequent  meals throughout the day instead of three large meals. ?This information is not intended to replace advice given to you by your health care provider. Make sure you discuss any questions you have with your health care provider. ?Document Revised: 12/12/2019 Document Reviewed: 12/18/2019 ?Elsevier Patient Education ? [employer]. ? ?

## 2023-06-06 ENCOUNTER — Ambulatory Visit: Payer: Medicare Other | Admitting: Family Medicine

## 2023-07-26 ENCOUNTER — Encounter: Payer: Self-pay | Admitting: Pediatrics

## 2023-07-26 ENCOUNTER — Ambulatory Visit (INDEPENDENT_AMBULATORY_CARE_PROVIDER_SITE_OTHER): Payer: Medicare Other | Admitting: Pediatrics

## 2023-07-26 VITALS — BP 94/62 | HR 86 | Temp 99.0°F | Ht 66.5 in | Wt 145.6 lb

## 2023-07-26 DIAGNOSIS — Z0100 Encounter for examination of eyes and vision without abnormal findings: Secondary | ICD-10-CM

## 2023-07-26 DIAGNOSIS — Z9049 Acquired absence of other specified parts of digestive tract: Secondary | ICD-10-CM | POA: Diagnosis not present

## 2023-07-26 DIAGNOSIS — Z1330 Encounter for screening examination for mental health and behavioral disorders, unspecified: Secondary | ICD-10-CM

## 2023-07-26 DIAGNOSIS — R1011 Right upper quadrant pain: Secondary | ICD-10-CM

## 2023-07-26 DIAGNOSIS — Z131 Encounter for screening for diabetes mellitus: Secondary | ICD-10-CM

## 2023-07-26 DIAGNOSIS — Z7689 Persons encountering health services in other specified circumstances: Secondary | ICD-10-CM

## 2023-07-26 DIAGNOSIS — E7849 Other hyperlipidemia: Secondary | ICD-10-CM

## 2023-07-26 DIAGNOSIS — Z13220 Encounter for screening for lipoid disorders: Secondary | ICD-10-CM

## 2023-07-26 NOTE — Patient Instructions (Signed)

## 2023-07-26 NOTE — Progress Notes (Signed)
 Establish Care Note  BP 94/62   Pulse 86   Temp 99 F (37.2 C) (Oral)   Ht 5' 6.5" (1.689 m)   Wt 145 lb 9.6 oz (66 kg)   SpO2 98%   BMI 23.15 kg/m  90  Subjective:    Patient ID: Charlotte Hopkins, female    DOB: May 14, 1976, 48 y.o.   MRN: [id number]  HPI: Charlotte Hopkins is a 48 y.o. female  Chief Complaint  Patient presents with   Abdominal Pain    Patient states she has been having pressure as if she never had her gallbladder removed. States this has been going on since her gallbladder removal which was about 2 or 3 years ago. States she feels the pressure more after eating.    Anxiety    Patient states she is currently seeing a therapist for her stress an anxiety, not currently on any medications    Establishing care, the following was discussed today:  Discussed the use of AI scribe software for clinical note transcription with the patient, who gave verbal consent to proceed.  History of Present Illness   The patient presents with persistent abdominal pain post-cholecystectomy. She is accompanied by a family member who ensures she discusses her symptoms.  The abdominal pain began after a cholecystectomy performed two to three years ago for a large gallstone. The pain is described as a constant pressure under her ribs, worsening after eating, particularly large meals. It is present 24 hours a day and intensifies postprandially. The pain is exacerbated by leaning back, and even the pressure of a blanket can be uncomfortable. She cannot wear a bra or seatbelt due to the discomfort. Occasionally, the pain radiates slightly to the back but remains primarily under the ribs. No acid reflux or greasy stools are reported, but there is an increase in constipation since the pain worsened, which is a change from her previous bowel habits post-cholecystectomy.  She reports an issue with her right eyelid, which feels as though it is not folding correctly, sometimes drooping into her eye and  affecting her vision, especially when tired or after crying. No dry or itchy eyes are reported.  She does not consume alcohol and has a history of anxiety, previously managed with medication until five years ago, after which she switched to using marijuana and reports feeling more stable.      #HM Will review HM records and updated as needed.  Relevant past medical, surgical, family and social history reviewed and updated as indicated. Interim medical history since our last visit reviewed. Allergies and medications reviewed and updated.  ROS per HPI unless specifically indicated above     Objective:    BP 94/62   Pulse 86   Temp 99 F (37.2 C) (Oral)   Ht 5' 6.5" (1.689 m)   Wt 145 lb 9.6 oz (66 kg)   SpO2 98%   BMI 23.15 kg/m   Wt Readings from Last 3 Encounters:  07/26/23 145 lb 9.6 oz (66 kg)  07/21/21 160 lb 3.2 oz (72.7 kg)  07/06/21 161 lb 6.4 oz (73.2 kg)     Physical Exam Constitutional:      Appearance: Normal appearance.  HENT:     Head: Normocephalic and atraumatic.  Eyes:     Pupils: Pupils are equal, round, and reactive to light.  Cardiovascular:     Pulses: Normal pulses.  Abdominal:     General: Abdomen is flat. There is no distension.  Palpations: Abdomen is soft. There is no shifting dullness, fluid wave, hepatomegaly or mass.     Tenderness: There is abdominal tenderness in the right upper quadrant. There is no guarding or rebound. Negative signs include Murphy's sign.  Musculoskeletal:        General: Normal range of motion.     Cervical back: Normal range of motion.  Skin:    General: Skin is warm and dry.     Capillary Refill: Capillary refill takes less than 2 seconds.  Neurological:     General: No focal deficit present.     Mental Status: She is alert. Mental status is at baseline.  Psychiatric:        Mood and Affect: Mood normal.        Behavior: Behavior normal.         07/26/2023    8:47 AM  Depression screen PHQ 2/9   Decreased Interest 1  Down, Depressed, Hopeless 2  PHQ - 2 Score 3  Altered sleeping 3  Tired, decreased energy 3  Change in appetite 3  Feeling bad or failure about yourself  2  Trouble concentrating 3  Moving slowly or fidgety/restless 0  Suicidal thoughts 0  PHQ-9 Score 17  Difficult doing work/chores Very difficult        07/26/2023    8:47 AM  GAD 7 : Generalized Anxiety Score  Nervous, Anxious, on Edge 3  Control/stop worrying 3  Worry too much - different things 3  Trouble relaxing 3  Restless 3  Easily annoyed or irritable 3  Afraid - awful might happen 3  Total GAD 7 Score 21  Anxiety Difficulty Very difficult       Assessment & Plan:  Assessment & Plan   Right upper quadrant abdominal pain Status post laparoscopic cholecystectomy Chronic pain post-cholecystectomy, possible nerve damage, scarring, or retained stone would be more atypical. Recent constipation noted. Pt given strict return precautions and anticipatory guidance. - Order blood work including liver function tests and pancreatic enzymes. - Order abdominal ultrasound. - Consider CT scan if ultrasound is inconclusive. -     CBC -     Lipase -     Comprehensive metabolic panel -     US ABDOMEN LIMITED RUQ (LIVER/GB); Future  Encounter for eye exam Right eyelid drooping affecting vision, possible excess skin. No pain or dry eyes. Normal exam today. - Refer to ophthalmologist for evaluation. - Consider referral to plastic surgery based on ophthalmologist's recommendations. -     Ambulatory referral to Ophthalmology  Encounter to establish care Reviewed available patient record including history, medications, problem list. HM updated as able. Will review and/or request outside records (if applicable) and will fill remaining HM gaps as needed at follow up visit. Scheduled for physical exam in 2 weeks, Pap smear if consented, routine blood work planned. - Schedule physical exam in 2 weeks. - Perform  Pap smear during physical exam if she consents. - Conduct routine blood work.  Encounter for behavioral health screening As part of their intake evaluation, the patient was screened for depression, anxiety.  PHQ9 SCORE 17, GAD7 SCORE 21. Screening results positive for tested conditions. She follows with therapist, stable. - Continue therapy sessions. - Monitor symptoms and adjust treatment plan as needed.  Lipid screening -     Lipid panel  Diabetes mellitus screening -     Hemoglobin A1c  Follow up plan: Return in about 2 weeks (around 08/09/2023) for Physical w pap.  Byrd Hesselbach  Howell Pringle, MD

## 2023-07-27 ENCOUNTER — Ambulatory Visit
Admission: RE | Admit: 2023-07-27 | Discharge: 2023-07-27 | Disposition: A | Discharge status: Home / Self Care | Source: Ambulatory Visit | Attending: Pediatrics | Admitting: Pediatrics

## 2023-07-27 DIAGNOSIS — R1011 Right upper quadrant pain: Secondary | ICD-10-CM | POA: Insufficient documentation

## 2023-07-27 LAB — CBC
Hematocrit: 41.9 % (ref 34.0–46.6)
Hemoglobin: 13.6 g/dL (ref 11.1–15.9)
MCH: 31.3 pg (ref 26.6–33.0)
MCHC: 32.5 g/dL (ref 31.5–35.7)
MCV: 97 fL (ref 79–97)
Platelets: 194 10*3/uL (ref 150–450)
RBC: 4.34 x10E6/uL (ref 3.77–5.28)
RDW: 11.4 % — ABNORMAL LOW (ref 11.7–15.4)
WBC: 9.5 10*3/uL (ref 3.4–10.8)

## 2023-07-27 LAB — COMPREHENSIVE METABOLIC PANEL
ALT: 7 IU/L (ref 0–32)
AST: 7 IU/L (ref 0–40)
Albumin: 4.5 g/dL (ref 3.9–4.9)
Alkaline Phosphatase: 58 IU/L (ref 44–121)
BUN/Creatinine Ratio: 16 (ref 9–23)
BUN: 14 mg/dL (ref 6–24)
Bilirubin Total: 0.4 mg/dL (ref 0.0–1.2)
CO2: 20 mmol/L (ref 20–29)
Calcium: 9.7 mg/dL (ref 8.7–10.2)
Chloride: 106 mmol/L (ref 96–106)
Creatinine, Ser: 0.88 mg/dL (ref 0.57–1.00)
Globulin, Total: 2.6 g/dL (ref 1.5–4.5)
Glucose: 73 mg/dL (ref 70–99)
Potassium: 3.8 mmol/L (ref 3.5–5.2)
Sodium: 140 mmol/L (ref 134–144)
Total Protein: 7.1 g/dL (ref 6.0–8.5)
eGFR: 82 mL/min/{1.73_m2} (ref >59)

## 2023-07-27 LAB — LIPID PANEL
Chol/HDL Ratio: 4.6 ratio — ABNORMAL HIGH (ref 0.0–4.4)
Cholesterol, Total: 175 mg/dL (ref 100–199)
HDL: 38 mg/dL — ABNORMAL LOW (ref >39)
LDL Chol Calc (NIH): 123 mg/dL — ABNORMAL HIGH (ref 0–99)
Triglycerides: 76 mg/dL (ref 0–149)
VLDL Cholesterol Cal: 14 mg/dL (ref 5–40)

## 2023-07-27 LAB — HEMOGLOBIN A1C
Est. average glucose Bld gHb Est-mCnc: 105 mg/dL
Hgb A1c MFr Bld: 5.3 % (ref 4.8–5.6)

## 2023-07-27 LAB — LIPASE: Lipase: 30 U/L (ref 14–72)

## 2023-08-02 ENCOUNTER — Encounter: Payer: Self-pay | Admitting: Pediatrics

## 2023-08-09 ENCOUNTER — Ambulatory Visit (INDEPENDENT_AMBULATORY_CARE_PROVIDER_SITE_OTHER): Admitting: Pediatrics

## 2023-08-09 ENCOUNTER — Encounter: Payer: Self-pay | Admitting: Pediatrics

## 2023-08-09 VITALS — BP 102/65 | HR 75 | Temp 97.8°F | Ht 66.0 in | Wt 147.6 lb

## 2023-08-09 DIAGNOSIS — Z1231 Encounter for screening mammogram for malignant neoplasm of breast: Secondary | ICD-10-CM

## 2023-08-09 DIAGNOSIS — Z0000 Encounter for general adult medical examination without abnormal findings: Secondary | ICD-10-CM

## 2023-08-09 DIAGNOSIS — Z124 Encounter for screening for malignant neoplasm of cervix: Secondary | ICD-10-CM

## 2023-08-09 DIAGNOSIS — Z1330 Encounter for screening examination for mental health and behavioral disorders, unspecified: Secondary | ICD-10-CM

## 2023-08-09 DIAGNOSIS — Z1211 Encounter for screening for malignant neoplasm of colon: Secondary | ICD-10-CM

## 2023-08-09 NOTE — Patient Instructions (Signed)
 I will submit for the CT of your abdomen and then will call you for next steps  The cologuard will come to you to have colon cancer screening  You have an order for:  []   2D Mammogram  [x]   3D Mammogram  []   Bone Density     Please call for appointment:  [facility]  [address] [place] [address] [phone] [facility] [address] [place], [place] [address] [phone] [facility] [address] Geanie Logan [place], [place] [address] [phone]   Make sure to wear two-piece clothing.  No lotions, powders, or deodorants the day of the appointment. Make sure to bring picture ID and insurance card.  Bring list of medications you are currently taking including any supplements.   Schedule your [place] screening mammogram through MyChart!   Log into your MyChart account.  Go to 'Visit' (or 'Appointments' if on mobile App) --> Schedule an Appointment  Under 'Select a Reason for Visit' choose the Mammogram Screening option.  Complete the pre-visit questions and select the time and place that best fits your schedule.

## 2023-08-09 NOTE — Progress Notes (Unsigned)
 BP 102/65   Pulse 75   Temp 97.8 F (36.6 C) (Oral)   Ht 5\' 6"  (1.676 m)   Wt 147 lb 9.6 oz (67 kg)   SpO2 97%   BMI 23.82 kg/m    Annual Physical Exam - Female  Subjective:   CC: Annual Exam (Total his.)   Charlotte Hopkins is a 48 y.o. female patient here for a preventative health maintenance exam{Blank Single :19197::" and has no acute complaints.",". Additional topics discussed include:"}  Health Habits: DIET: {Desc; diets:16563} EXERCISE: *** times/week on average, activities include {misc; exercise types:16438} DENTAL EXAM: {UTDSTATUS:31041} EYE EXAM: {UTDSTATUS:31041}                       Relevant Gynecologic History LMP: No LMP recorded. Patient has had a hysterectomy.  Menstrual Status: {Menopause:31378}, Flow {Misc; menses description:16152} PAP History:  No Cervical Cancer Screening results to display.  History abnormal PAP: {yes/no:20286}  Sexual activity: {sexual partners:315163} Family history breast, ovarian cancer: No Domestic Violence Screen, feels safe at home: {yes/no:20286}  family history includes Asthma in her daughter and son; Atrial fibrillation in her mother; Cancer in her maternal grandfather; Skin cancer in her maternal grandmother.  Social History   Tobacco Use   Smoking status: Every Day    Current packs/day: 1.00    Types: Cigarettes   Smokeless tobacco: Never  Vaping Use   Vaping status: Never Used  Substance Use Topics   Alcohol use: No   Drug use: Yes    Types: Marijuana   Social History   Social History Narrative   Lives with daughter.    Social drivers questionnaire is reviewed and is positive for : {SDOH Challenges:24934}. Follow up: {sdoh follow up:67204::"None"}  Depression Screening:     08/09/2023    1:11 PM 07/26/2023    8:47 AM  Depression screen PHQ 2/9  Decreased Interest 2 1  Down, Depressed, Hopeless 2 2  PHQ - 2 Score 4 3  Altered sleeping 3 3  Tired, decreased energy 3 3  Change in appetite 2 3   Feeling bad or failure about yourself  2 2  Trouble concentrating 3 3  Moving slowly or fidgety/restless 0 0  Suicidal thoughts 0 0  PHQ-9 Score 17 17  Difficult doing work/chores Very difficult Very difficult       08/09/2023    1:11 PM 07/26/2023    8:47 AM  GAD 7 : Generalized Anxiety Score  Nervous, Anxious, on Edge 3 3  Control/stop worrying 3 3  Worry too much - different things 3 3  Trouble relaxing 3 3  Restless 3 3  Easily annoyed or irritable 2 3  Afraid - awful might happen 3 3  Total GAD 7 Score 20 21  Anxiety Difficulty Extremely difficult Very difficult    Mental Health Plan: {Plan:9253917664}  Self Management Goals  Goals   None     Health Maintenance Colon Cancer Screening : {UTDSTATUS:31041} Mammogram : {UTDSTATUS:31041} DXA scan : {UTDSTATUS:31041} Immunizations : {Immunizations:5306}  Review of Systems See HPI for relevant ROS.  No outpatient medications prior to visit.   No facility-administered medications prior to visit.     Patient Active Problem List   Diagnosis Date Noted   Status post laparoscopic cholecystectomy 07/21/2021    Objective:   Vitals:   08/09/23 1305  BP: 102/65  Pulse: 75  Temp: 97.8 F (36.6 C)  Height: 5\' 6"  (1.676 m)  Weight: 147 lb 9.6  oz (67 kg)  SpO2: 97%  TempSrc: Oral  BMI (Calculated): 23.83    Body mass index is 23.82 kg/m.  Physical Exam ***   Assessment and Plan:   Annual physical exam  Encounter for behavioral health screening  Screening for cervical cancer -     Cytology - PAP  Encounter for screening mammogram for malignant neoplasm of breast -     3D Screening Mammogram, Left and Right; Future  Screen for colon cancer -     Cologuard     This plan was discussed with the patient and questions were answered. There were no further concerns.  Follow up as indicated, or sooner should any new problems arise, if conditions worsen, or if they are otherwise concerned.   See  patient instructions for additional information.  Jackolyn Confer, MD  Family Medicine      No future appointments.

## 2023-08-14 ENCOUNTER — Other Ambulatory Visit: Payer: Self-pay | Admitting: Pediatrics

## 2023-08-14 DIAGNOSIS — R1011 Right upper quadrant pain: Secondary | ICD-10-CM

## 2023-08-14 NOTE — Progress Notes (Signed)
 Persistent RUQ pain, negative ultrasound. Similar to cholestatic pain in the past.  Given persistence of symptoms, requested CT. RUQ on exam, tolerates minimal palpation. Labs reassuring.  Will rule out anatomic cause to pain but may be due to chronic post op pain and/or MSK related.  Jackolyn Confer, MD

## 2023-08-15 ENCOUNTER — Encounter: Payer: Self-pay | Admitting: Pediatrics

## 2023-08-17 ENCOUNTER — Ambulatory Visit
Admission: RE | Admit: 2023-08-17 | Discharge: 2023-08-17 | Disposition: A | Discharge status: Home / Self Care | Source: Ambulatory Visit | Attending: Pediatrics | Admitting: Pediatrics

## 2023-08-17 DIAGNOSIS — R1011 Right upper quadrant pain: Secondary | ICD-10-CM | POA: Diagnosis present

## 2023-08-17 MED ORDER — IOHEXOL 300 MG/ML  SOLN
85.0000 mL | Freq: Once | INTRAMUSCULAR | Status: AC | PRN
  Administered 2023-08-17: 85 mL via INTRAVENOUS

## 2023-08-28 ENCOUNTER — Other Ambulatory Visit: Payer: Self-pay | Admitting: Pediatrics

## 2023-08-28 DIAGNOSIS — N949 Unspecified condition associated with female genital organs and menstrual cycle: Secondary | ICD-10-CM

## 2023-08-28 NOTE — Progress Notes (Signed)
 Pelvic ultrasound order in place to further assess adnexal cyst.  Hadassah Letters, MD

## 2023-08-30 ENCOUNTER — Ambulatory Visit
Admission: RE | Admit: 2023-08-30 | Discharge: 2023-08-30 | Disposition: A | Discharge status: Home / Self Care | Source: Ambulatory Visit | Attending: Pediatrics | Admitting: Pediatrics

## 2023-08-30 DIAGNOSIS — N949 Unspecified condition associated with female genital organs and menstrual cycle: Secondary | ICD-10-CM | POA: Insufficient documentation

## 2023-08-30 LAB — COLOGUARD: COLOGUARD: NEGATIVE

## 2023-08-31 ENCOUNTER — Other Ambulatory Visit: Payer: Self-pay | Admitting: Pediatrics

## 2023-08-31 DIAGNOSIS — R109 Unspecified abdominal pain: Secondary | ICD-10-CM

## 2023-08-31 NOTE — Progress Notes (Signed)
 US  renal ordered to compare to CT given patient concerns for flank pain. U/A and culture ordered as well.  Hadassah Letters, MD

## 2023-09-05 ENCOUNTER — Encounter: Payer: Self-pay | Admitting: Nurse Practitioner

## 2023-09-05 ENCOUNTER — Other Ambulatory Visit

## 2023-09-05 ENCOUNTER — Ambulatory Visit
Admission: RE | Admit: 2023-09-05 | Discharge: 2023-09-05 | Disposition: A | Discharge status: Home / Self Care | Source: Ambulatory Visit | Attending: Pediatrics | Admitting: Pediatrics

## 2023-09-05 DIAGNOSIS — R109 Unspecified abdominal pain: Secondary | ICD-10-CM | POA: Insufficient documentation

## 2023-09-05 LAB — URINALYSIS, ROUTINE W REFLEX MICROSCOPIC
Bilirubin, UA: NEGATIVE
Glucose, UA: NEGATIVE
Ketones, UA: NEGATIVE
Leukocytes,UA: NEGATIVE
Nitrite, UA: NEGATIVE
Protein,UA: NEGATIVE
Specific Gravity, UA: 1.02 (ref 1.005–1.030)
Urobilinogen, Ur: 0.2 mg/dL (ref 0.2–1.0)
pH, UA: 7 (ref 5.0–7.5)

## 2023-09-05 LAB — MICROSCOPIC EXAMINATION
Bacteria, UA: NONE SEEN
WBC, UA: NONE SEEN /HPF (ref 0–5)

## 2023-09-05 NOTE — Progress Notes (Signed)
 Contacted via MyChart   Good evening Charlotte Hopkins, this is Brigitta Pricer one of the Loss adjuster, chartered for [facility].  Dr. Juliette Oh is on vacation at present, but wanted to alert you that your urine has returned and is overall reassuring with exception of just trace blood.  I see you went for renal ultrasound and we will let you know when results return for this.

## 2023-09-07 LAB — URINE CULTURE: Organism ID, Bacteria: NO GROWTH

## 2023-09-12 ENCOUNTER — Other Ambulatory Visit: Payer: Self-pay | Admitting: Pediatrics

## 2023-09-12 DIAGNOSIS — N200 Calculus of kidney: Secondary | ICD-10-CM

## 2023-09-12 DIAGNOSIS — N83201 Unspecified ovarian cyst, right side: Secondary | ICD-10-CM

## 2023-09-12 NOTE — Progress Notes (Signed)
 Ovarian cysts - suprapubic pain. Plan to send for gynecology consult.  Abdominal pain - has generalized abdominal pain found to have renal calculi, I'm not clear if this would explain the cause of her pain but prefers to discuss with urology.  Hadassah Letters, MD

## 2023-09-13 ENCOUNTER — Ambulatory Visit (INDEPENDENT_AMBULATORY_CARE_PROVIDER_SITE_OTHER): Admitting: Urology

## 2023-09-13 ENCOUNTER — Encounter: Payer: Self-pay | Admitting: Urology

## 2023-09-13 VITALS — BP 96/60 | HR 91 | Ht 66.0 in | Wt 147.0 lb

## 2023-09-13 DIAGNOSIS — N200 Calculus of kidney: Secondary | ICD-10-CM

## 2023-09-13 NOTE — Patient Instructions (Signed)
 Charlotte Hopkins

## 2023-09-13 NOTE — Progress Notes (Signed)
 09/13/23 8:39 AM   Charlotte Hopkins [birth year] [id number]  CC: Nephrolithiasis  HPI: 48 year old female with history of chronic back pain and depression as well as kidney stones who presents with right-sided abdominal and flank pressure over the last few years.  This has been present since she had a laparoscopic cholecystectomy.  She has passed kidney stone spontaneously before, and this feels very different.  Her symptoms are more of a pressure, not pain or renal colic.  She also notices more discomfort with certain articles of clothing like bra or pants that press against the area.  She denies any dysuria or gross hematuria.  CT scan shows small nonobstructive renal stones bilaterally, no hydronephrosis, recent renal ultrasound confirms these findings.  Bladder normal-appearing.  Recent urinalysis with PCP benign.   PMH: Past Medical History:  Diagnosis Date   Anemia    Anxiety    Benign neoplasm of skin of trunk 11/09/2003   Bipolar disorder (HCC)    Cancer (HCC) skin cancer   Chronic back pain    Depression    Family history of adverse reaction to anesthesia    mom had n/v   Headache disorder 07/26/2015   History of kidney stones    History of malignant melanoma of skin 11/09/2003   Kidney stone    Medication overuse headache 06/16/2015   Personality disorder (HCC)    Pneumonia    PTSD (post-traumatic stress disorder)     Surgical History: Past Surgical History:  Procedure Laterality Date   CHOLECYSTECTOMY     FRACTURE SURGERY     ankle right   PARTIAL HYSTERECTOMY     TEMPOROMANDIBULAR JOINT SURGERY     WISDOM TOOTH EXTRACTION      Family History: Family History  Problem Relation Age of Onset   Atrial fibrillation Mother    Asthma Daughter    Asthma Son    Skin cancer Maternal Grandmother    Cancer Maternal Grandfather     Social History:  reports that she has been smoking cigarettes. She has never used smokeless tobacco. She reports current drug use.  Drug: Marijuana. She reports that she does not drink alcohol.  Physical Exam: BP 96/60 (BP Location: Left Arm, Patient Position: Sitting, Cuff Size: Normal)   Pulse 91   Ht 5\' 6"  (1.676 m)   Wt 147 lb (66.7 kg)   BMI 23.73 kg/m    Constitutional:  Alert and oriented, No acute distress. Cardiovascular: No clubbing, cyanosis, or edema. Respiratory: Normal respiratory effort, no increased work of breathing. GI: Abdomen is soft, nontender, nondistended, no abdominal masses  Pertinent Imaging: I have personally viewed and interpreted the recent CT and renal ultrasound showing small nonobstructive renal stones bilaterally, no hydronephrosis.  Assessment & Plan:   48 year old female with history of kidney stones as well as chronic back pain who presents with a few years of right-sided flank and abdominal pressure of unclear etiology.  This has been present since she had a laparoscopic cholecystectomy.  Reassurance was provided regarding her CT and renal ultrasound showing small nonobstructive renal stones that would not be contributing to the symptoms.  We discussed options like surveillance, shockwave lithotripsy, or ureteroscopy, and I strongly recommended surveillance, is very low likelihood her symptoms would improve with stone removal.  She is in agreement.  We discussed general stone prevention strategies including adequate hydration with goal of producing 2.5 L of urine daily, increasing citric acid intake, increasing calcium intake during high oxalate meals, minimizing animal protein, and  decreasing salt intake. Information about dietary recommendations given today.   Follow-up with urology as needed  Jay Meth, MD 09/13/2023  [facility] [address] [place], [place] [address] [phone]

## 2023-10-16 ENCOUNTER — Telehealth: Payer: Self-pay

## 2023-10-16 NOTE — Telephone Encounter (Signed)
 Ok for E2C2 to review.  Reached out to patient to schedule much needed annual medicare wellness visit. When she returns the call please transfer to clinic.

## 2023-10-24 ENCOUNTER — Encounter: Payer: Self-pay | Admitting: Obstetrics

## 2023-10-24 ENCOUNTER — Other Ambulatory Visit (HOSPITAL_COMMUNITY)
Admission: RE | Admit: 2023-10-24 | Discharge: 2023-10-24 | Disposition: A | Discharge status: Home / Self Care | Source: Ambulatory Visit | Attending: Obstetrics | Admitting: Obstetrics

## 2023-10-24 ENCOUNTER — Ambulatory Visit: Admitting: Obstetrics

## 2023-10-24 VITALS — BP 103/66 | HR 87 | Ht 66.0 in | Wt 144.0 lb

## 2023-10-24 DIAGNOSIS — Z01419 Encounter for gynecological examination (general) (routine) without abnormal findings: Secondary | ICD-10-CM | POA: Diagnosis present

## 2023-10-24 DIAGNOSIS — N83201 Unspecified ovarian cyst, right side: Secondary | ICD-10-CM | POA: Diagnosis not present

## 2023-10-24 DIAGNOSIS — Z124 Encounter for screening for malignant neoplasm of cervix: Secondary | ICD-10-CM | POA: Diagnosis present

## 2023-10-24 DIAGNOSIS — N83202 Unspecified ovarian cyst, left side: Secondary | ICD-10-CM

## 2023-10-24 DIAGNOSIS — Z113 Encounter for screening for infections with a predominantly sexual mode of transmission: Secondary | ICD-10-CM | POA: Diagnosis not present

## 2023-10-24 DIAGNOSIS — N898 Other specified noninflammatory disorders of vagina: Secondary | ICD-10-CM | POA: Diagnosis present

## 2023-10-24 DIAGNOSIS — N949 Unspecified condition associated with female genital organs and menstrual cycle: Secondary | ICD-10-CM | POA: Insufficient documentation

## 2023-10-24 DIAGNOSIS — Z1151 Encounter for screening for human papillomavirus (HPV): Secondary | ICD-10-CM | POA: Diagnosis not present

## 2023-10-24 DIAGNOSIS — Z90710 Acquired absence of both cervix and uterus: Secondary | ICD-10-CM | POA: Diagnosis not present

## 2023-10-24 NOTE — Progress Notes (Signed)
 GYNECOLOGY PROGRESS NOTE  Subjective:  PCP: Hadassah Letters, MD  Patient ID: Charlotte Hopkins, female    DOB: 06-25-1975, 48 y.o.   MRN: [id number]  HPI  Patient is a 48 y.o. [phone] female who presents for referral from [facility], Geraldine Kling MD for ovarian cysts. She saw PCP for chronic right sided hip pain, a CT was ordered which saw possible adnexal cysts and pelvic US  showed bilateral paratubal cysts vs fallopian tube segments. Pt had a partial hysterectomy 20 years ago for endometriosis. Unsure if she still has cervix, states PCP didn't think so. Having some vaginal discharge, requesting full STI panel today. Has a new partner after 69yrs.   LMP: 28yo Last pap: unknown Denies hx of abnormal STI Hx: denies  OB History  Gravida Para Term Preterm AB Living  4 3 3  1 3   SAB IAB Ectopic Multiple Live Births  1        # Outcome Date GA Lbr Len/2nd Weight Sex Type Anes PTL Lv  4 SAB           3 Term           2 Term           1 Term            Past Medical History:  Diagnosis Date   Anemia    Anxiety    Benign neoplasm of skin of trunk 11/09/2003   Bipolar disorder (HCC)    Cancer (HCC) skin cancer   Chronic back pain    Depression    Family history of adverse reaction to anesthesia    mom had n/v   Headache disorder 07/26/2015   History of kidney stones    History of malignant melanoma of skin 11/09/2003   Kidney stone    Medication overuse headache 06/16/2015   Personality disorder (HCC)    Pneumonia    PTSD (post-traumatic stress disorder)    Patient Active Problem List   Diagnosis Date Noted   Status post laparoscopic cholecystectomy 07/21/2021   Past Surgical History:  Procedure Laterality Date   CHOLECYSTECTOMY     FRACTURE SURGERY     ankle right   PARTIAL HYSTERECTOMY     TEMPOROMANDIBULAR JOINT SURGERY     WISDOM TOOTH EXTRACTION     Family History  Problem Relation Age of Onset   Atrial fibrillation Mother    Asthma Daughter     Asthma Son    Skin cancer Maternal Grandmother    Cancer Maternal Grandfather    Social History   Socioeconomic History   Marital status: Single    Spouse name: Not on file   Number of children: 1   Years of education: Not on file   Highest education level: Not on file  Occupational History   Not on file  Tobacco Use   Smoking status: Every Day    Current packs/day: 1.00    Types: Cigarettes   Smokeless tobacco: Never  Vaping Use   Vaping status: Never Used  Substance and Sexual Activity   Alcohol use: No   Drug use: Yes    Types: Marijuana   Sexual activity: Yes  Other Topics Concern   Not on file  Social History Narrative   Lives with daughter.   Social Drivers of Corporate investment banker Strain: Low Risk  (07/26/2023)   Overall Financial Resource Strain (CARDIA)    Difficulty of Paying Living Expenses: Not hard  at all  Food Insecurity: No Food Insecurity (07/26/2023)   Hunger Vital Sign    Worried About Running Out of Food in the Last Year: Never true    Ran Out of Food in the Last Year: Never true  Transportation Needs: No Transportation Needs (07/26/2023)   PRAPARE - Administrator, Civil Service (Medical): No    Lack of Transportation (Non-Medical): No  Physical Activity: Inactive (07/26/2023)   Exercise Vital Sign    Days of Exercise per Week: 0 days    Minutes of Exercise per Session: 0 min  Stress: Stress Concern Present (07/26/2023)   [employer] of Occupational Health - Occupational Stress Questionnaire    Feeling of Stress : Rather much  Social Connections: Socially Isolated (07/26/2023)   Social Connection and Isolation Panel [NHANES]    Frequency of Communication with Friends and Family: More than three times a week    Frequency of Social Gatherings with Friends and Family: More than three times a week    Attends Religious Services: Never    Database administrator or Organizations: No    Attends Banker Meetings:  Never    Marital Status: Separated  Intimate Partner Violence: Not At Risk (07/26/2023)   Humiliation, Afraid, Rape, and Kick questionnaire    Fear of Current or Ex-Partner: No    Emotionally Abused: No    Physically Abused: No    Sexually Abused: No   No current outpatient medications on file prior to visit.   No current facility-administered medications on file prior to visit.   Allergies  Allergen Reactions   Fentanyl Anaphylaxis   Adhesive [Tape] Rash   Neosporin [Neomycin-Bacitracin Zn-Polymyx] Rash   Zantac [Ranitidine Hcl] Rash    Looked like Chicken pox   Review of Systems Pertinent items are noted in HPI.   Objective:   Blood pressure 103/66, pulse 87, height 5' 6 (1.676 m), weight 144 lb (65.3 kg). Body mass index is 23.24 kg/m.  General appearance: alert and cooperative Abdomen: soft, non-tender; bowel sounds normal; no masses,  no organomegaly Pelvic: external genitalia normal, no adnexal masses or tenderness, no cervical motion tenderness, rectovaginal septum normal, uterus normal size, shape, and consistency, uterus surgically absent, and vagina normal, with scant, thin white discharge. Redundant tissue in the vaginal cuff, versus cervix. Pap obtained.  Extremities: extremities normal, atraumatic, no cyanosis or edema Neurologic: Grossly normal  Ultrasound 08/30/23 FINDINGS: Uterus   Surgically absent.   Right ovary   Measurements: 2.4 x 1.8 x 2.2 cm = volume: 5 mL. Multilobulated cyst adjacent to the right ovary measuring 3.2 x 1.8 x 1.8 cm.   Left ovary   Measurements: 2.7 x 1.7 x 2.7 cm = volume: 6 mL. Multilobulated cyst adjacent to the left ovary measuring 3.6 x 2.0 x 2.3 cm.   Other findings   No abnormal free fluid.   IMPRESSION: 1. Multilobulated cysts adjacent to both ovaries measuring up to 3.2 cm on the right and 3.6 cm on the left. These may represent paraovarian cysts or possibly distension of residual fallopian tubes. Recommend  follow-up pelvic ultrasound in 6 months to assess for stability. 2. Status post hysterectomy.  Assessment/Plan:   1. Adnexal cyst   2. Cervical cancer screening   3. Vaginal discharge   4. Screening examination for STI    48 y.o. [phone] with bilateral adnexal cysts, likely distension of residual fallopian tubes due to their bilateral symmetry, but could also be paraovarian cysts. Recommend  radiology's recommendation of repeat US  in 6 mos to assess for stability and notify us  of any change in symptoms, we have ordered that US  today for future. We also saw what could be cervical tissue today, although doubtful, pap was obtained out of precaution. Full STI testing today at pt's request, recommend she abstain until she knows results and knows new partner is also negative. Follow up prn.    Sofia Dunn, DO [facility]

## 2023-10-25 ENCOUNTER — Telehealth: Payer: Self-pay

## 2023-10-25 LAB — HEP, RPR, HIV PANEL
HIV Screen 4th Generation wRfx: NONREACTIVE
Hepatitis B Surface Ag: NEGATIVE
RPR Ser Ql: NONREACTIVE

## 2023-10-25 NOTE — Addendum Note (Signed)
 Addended by: Juanita Norlander on: 10/25/2023 03:46 PM   Modules accepted: Orders

## 2023-10-25 NOTE — Telephone Encounter (Signed)
 Left voice message that she needs her US  at hospital around 02/27/24,

## 2023-10-25 NOTE — Addendum Note (Signed)
 Addended by: Sofia Dunn on: 10/25/2023 10:16 AM   Modules accepted: Orders

## 2023-10-29 LAB — CERVICOVAGINAL ANCILLARY ONLY
Bacterial Vaginitis (gardnerella): POSITIVE — AB
Candida Glabrata: NEGATIVE
Candida Vaginitis: NEGATIVE
Chlamydia: NEGATIVE
Comment: NEGATIVE
Comment: NEGATIVE
Comment: NEGATIVE
Comment: NEGATIVE
Comment: NEGATIVE
Comment: NORMAL
Neisseria Gonorrhea: NEGATIVE
Trichomonas: NEGATIVE

## 2023-10-30 LAB — CYTOLOGY - PAP
Adequacy: ABSENT
Comment: NEGATIVE
Diagnosis: NEGATIVE
High risk HPV: NEGATIVE

## 2023-11-05 ENCOUNTER — Other Ambulatory Visit: Payer: Self-pay

## 2023-11-05 ENCOUNTER — Ambulatory Visit: Payer: Self-pay | Admitting: Obstetrics

## 2023-11-05 DIAGNOSIS — B9689 Other specified bacterial agents as the cause of diseases classified elsewhere: Secondary | ICD-10-CM

## 2023-11-05 MED ORDER — METRONIDAZOLE 500 MG PO TABS: 500.0000 mg | ORAL_TABLET | Freq: Two times a day (BID) | ORAL | 0 refills | Status: AC

## 2023-11-05 NOTE — Progress Notes (Signed)
 Called pt no answer, left message to return call to go over results.

## 2023-11-05 NOTE — Progress Notes (Signed)
 Medication sent, stated she has a discharge with odor.

## 2023-12-21 ENCOUNTER — Other Ambulatory Visit: Payer: Self-pay | Admitting: Pediatrics

## 2023-12-21 DIAGNOSIS — R4586 Emotional lability: Secondary | ICD-10-CM

## 2023-12-21 NOTE — Progress Notes (Signed)
 Therapy referral placed.  Charlotte SHAUNNA Nett, MD

## 2024-01-23 ENCOUNTER — Ambulatory Visit (INDEPENDENT_AMBULATORY_CARE_PROVIDER_SITE_OTHER): Admitting: Licensed Clinical Social Worker

## 2024-01-23 DIAGNOSIS — F319 Bipolar disorder, unspecified: Secondary | ICD-10-CM | POA: Insufficient documentation

## 2024-01-23 DIAGNOSIS — F4310 Post-traumatic stress disorder, unspecified: Secondary | ICD-10-CM | POA: Insufficient documentation

## 2024-01-23 DIAGNOSIS — F481 Depersonalization-derealization syndrome: Secondary | ICD-10-CM

## 2024-01-23 DIAGNOSIS — F410 Panic disorder [episodic paroxysmal anxiety] without agoraphobia: Secondary | ICD-10-CM | POA: Diagnosis not present

## 2024-01-23 DIAGNOSIS — F411 Generalized anxiety disorder: Secondary | ICD-10-CM | POA: Diagnosis not present

## 2024-01-23 DIAGNOSIS — Z8659 Personal history of other mental and behavioral disorders: Secondary | ICD-10-CM | POA: Insufficient documentation
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
## 2024-02-11 ENCOUNTER — Ambulatory Visit: Admitting: Licensed Clinical Social Worker

## 2024-02-27 ENCOUNTER — Ambulatory Visit
Admission: RE | Admit: 2024-02-27 | Discharge: 2024-02-27 | Disposition: A | Discharge status: Home / Self Care | Source: Ambulatory Visit | Attending: Obstetrics | Admitting: Obstetrics

## 2024-02-27 DIAGNOSIS — N949 Unspecified condition associated with female genital organs and menstrual cycle: Secondary | ICD-10-CM | POA: Diagnosis present

## 2024-03-11 ENCOUNTER — Ambulatory Visit: Admitting: Licensed Clinical Social Worker

## 2024-03-11 DIAGNOSIS — F319 Bipolar disorder, unspecified: Secondary | ICD-10-CM

## 2024-03-11 DIAGNOSIS — F4310 Post-traumatic stress disorder, unspecified: Secondary | ICD-10-CM | POA: Diagnosis not present
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
## 2024-04-08 ENCOUNTER — Ambulatory Visit: Admitting: Licensed Clinical Social Worker

## 2024-04-22 ENCOUNTER — Ambulatory Visit: Admitting: Licensed Clinical Social Worker

## 2024-05-06 ENCOUNTER — Ambulatory Visit: Admitting: Licensed Clinical Social Worker

## 2024-05-20 ENCOUNTER — Ambulatory Visit: Admitting: Licensed Clinical Social Worker

## 2024-06-19 ENCOUNTER — Ambulatory Visit: Admitting: Licensed Clinical Social Worker

## 2024-06-19 DIAGNOSIS — F411 Generalized anxiety disorder: Secondary | ICD-10-CM

## 2024-06-19 DIAGNOSIS — F319 Bipolar disorder, unspecified: Secondary | ICD-10-CM

## 2024-06-19 DIAGNOSIS — F4310 Post-traumatic stress disorder, unspecified: Secondary | ICD-10-CM

## 2024-06-19 NOTE — Progress Notes (Unsigned)
 [facility] Counselor/Therapist Progress Note  Patient ID: Charlotte Hopkins, MRN: [id number]    Date: 06/19/24  Time Spent: 103  pm - 200 pm : 57 Minutes  Treatment Type: Individual Therapy.  Reported Symptoms: Patient reports being diagnosed with panic disorder, patient has experienced disassociation, fear, PTSD and Bipolar I and anxiety. Patient reports being in some form of therapy since age 49.     Mental Status Exam: Appearance:  Casual     Behavior: Appropriate  Motor: Normal  Speech/Language:  Clear and Coherent  Affect: Appropriate  Mood: normal  Thought process: normal  Thought content:   WNL  Sensory/Perceptual disturbances:   WNL  Orientation: oriented to person, place, time/date, situation, day of week, month of year, and year  Attention: Good  Concentration: Good  Memory: WNL  Fund of knowledge:  Good  Insight:   Good  Judgment:  Good  Impulse Control: Good    Risk Assessment: Danger to Self:  No Self-injurious Behavior: No Danger to Others: No Duty to Warn:no Physical Aggression / Violence:No  Access to Firearms a concern: No  Gang Involvement:No    Subjective:    Charlotte Hopkins participated in person from office,  and consented to treatment. Therapist participated from office.    Reported Symptoms: ***  Mental Status Exam: Appearance:  {PSY:22683}     Behavior: {PSY:21022743}  Motor: {PSY:22302}  Speech/Language:  {PSY:22685}  Affect: {PSY:22687}  Mood: {PSY:31886}  Thought process: {PSY:31888}  Thought content:   {PSY:305-199-6758}  Sensory/Perceptual disturbances:   {PSY:[phone]}  Orientation: {PSY:30297}  Attention: {PSY:22877}  Concentration: {PSY:781-287-8089}  Memory: {[phone]}  Fund of knowledge:  {PSY:781-287-8089}  Insight:   {PSY:781-287-8089}  Judgment:  {PSY:781-287-8089}  Impulse Control: {PSY:781-287-8089}   Risk Assessment: Danger to Self:  {PSY:22692} Self-injurious Behavior: {PSY:22692} Danger to Others:  {PSY:22692} Duty to Warn:{PSY:311194} Physical Aggression / Violence:{PSY:21197} Access to Firearms a concern: {PSY:21197} Gang Involvement:{PSY:21197}  Subjective:   Charlotte Hopkins participated from {Patient Location:[address]::home}, via {LBBHVIDEOORPHONE:26720}, and consented to treatment. Therapist participated from {LBBHPROVIDERLOCATION:26721}. We met online due to COVID pandemic.   ***   Interventions: {PSY:[phone]}  Diagnosis: No diagnosis found.   Plan: ***Patient is to use CBT, mindfulness and coping skills to help manage decrease symptoms associated with their diagnosis.   Long-term goal:   ***Reduce overall level, frequency, and intensity of the feelings of depression, anxiety and panic evidenced by       decreased irritability, negative self talk, and helpless feelings from 6 to 7 days/week to 0 to 1 days/week per client report for at least 3 consecutive months.  Short-term goal:  ***Verbally express understanding of the relationship between feelings of depression, anxiety and their impact on thinking patterns and behaviors. Verbalize an understanding of the role that distorted thinking plays in creating fears, excessive worry, and ruminations.  Damien Junk MSW, LCSW/DATE

## 2024-07-07 ENCOUNTER — Ambulatory Visit: Admitting: Licensed Clinical Social Worker

## 2024-08-12 ENCOUNTER — Encounter: Admitting: Pediatrics
